# Patient Record
Sex: Female | Born: 1950 | Race: Black or African American | Hispanic: No | Marital: Married | Smoking: Never smoker
Health system: Southern US, Community
[De-identification: ages and names within clinical notes are randomized; demographics above are authoritative.]

## PROBLEM LIST (undated history)

## (undated) DIAGNOSIS — K5790 Diverticulosis of intestine, part unspecified, without perforation or abscess without bleeding: Secondary | ICD-10-CM

## (undated) DIAGNOSIS — D649 Anemia, unspecified: Secondary | ICD-10-CM

## (undated) DIAGNOSIS — I10 Essential (primary) hypertension: Secondary | ICD-10-CM

## (undated) DIAGNOSIS — Z5189 Encounter for other specified aftercare: Secondary | ICD-10-CM

---

## 1999-06-21 HISTORY — PX: EYE SURGERY: SHX253

## 2002-03-04 ENCOUNTER — Encounter: Admission: RE | Admit: 2002-03-04 | Discharge: 2002-03-04 | Payer: Self-pay | Admitting: Internal Medicine

## 2002-03-25 ENCOUNTER — Other Ambulatory Visit: Admission: RE | Admit: 2002-03-25 | Discharge: 2002-03-25 | Payer: Self-pay | Admitting: Internal Medicine

## 2005-02-10 ENCOUNTER — Inpatient Hospital Stay (HOSPITAL_COMMUNITY): Admission: EM | Admit: 2005-02-10 | Discharge: 2005-02-13 | Payer: Self-pay | Admitting: Emergency Medicine

## 2005-02-10 ENCOUNTER — Ambulatory Visit: Payer: Self-pay | Admitting: Family Medicine

## 2005-02-11 ENCOUNTER — Encounter (INDEPENDENT_AMBULATORY_CARE_PROVIDER_SITE_OTHER): Payer: Self-pay | Admitting: Specialist

## 2005-06-09 ENCOUNTER — Encounter: Admission: RE | Admit: 2005-06-09 | Discharge: 2005-06-09 | Payer: Self-pay | Admitting: Internal Medicine

## 2008-06-10 ENCOUNTER — Encounter: Admission: RE | Admit: 2008-06-10 | Discharge: 2008-06-10 | Payer: Self-pay | Admitting: Internal Medicine

## 2009-06-10 ENCOUNTER — Encounter: Admission: RE | Admit: 2009-06-10 | Discharge: 2009-06-10 | Payer: Self-pay | Admitting: Internal Medicine

## 2010-06-16 ENCOUNTER — Encounter
Admission: RE | Admit: 2010-06-16 | Discharge: 2010-06-16 | Payer: Self-pay | Source: Home / Self Care | Attending: Internal Medicine | Admitting: Internal Medicine

## 2010-11-05 NOTE — Discharge Summary (Signed)
Lauren Berry, Lauren Berry             ACCOUNT NO.:  [id number]   MEDICAL RECORD NO.:  [id number]          PATIENT TYPE:  INP   LOCATION:  6737                         FACILITY:  [facility]   PHYSICIAN:  Pearlean Brownie, M.D.DATE OF BIRTH:  14-Dec-1950   DATE OF ADMISSION:  02/10/2005  DATE OF DISCHARGE:  02/13/2005                                 DISCHARGE SUMMARY   PRIMARY CARE PHYSICIAN:  Barth Kirks, M.D.   DISCHARGE DIAGNOSES:  1.  Gastrointestinal bleed secondary to a diverticulum.  2.  Anemia secondary to gastrointestinal bleed.  3.  Hypertension.  4.  Obesity.   PROCEDURE:  Colonoscopy performed by Dr. Ewing Schlein revealed a diverticulum.  No  active source of bleeding.   LABORATORIES:  Hemoglobin on admission was 11.8, hematocrit on admission was  36.  Hemoglobin trended from 11.8 to 10.4 to 10.2 to 9, and was 10 on the  date of discharge and stable.  White count was 8.8 on the day of discharge.  Platelet count was 321.  BMP on the date of discharge:  Sodium 142,  potassium 3.8, chloride 107, bicarbonate 28, glucose 118, BUN 4, creatinine  0.8, calcium 8.9.  The patient had a urinalysis that was negative and  normal.  She also had a PT of 14.1, INR 1.1, and PTT 38.  There were GI  biopsy results pending at the time of this dictation.   HISTORY AND PHYSICAL:  Please see the dictated history and physical on  chart.   HOSPITAL COURSE:  1.  GI bleed.  The patient was admitted to a telemetry floor.  The patient      was typed and crossed for 2 units of blood.  Her hemoglobin was      monitored closely, given her GI bleed, for concern over exsanguination.      GI was consulted.  The patient underwent a bowel prep and then      colonoscopy within 24 hours.  The colonoscopy revealed no active source      of bleeding and revealed only a diverticulum which may be the possible      source of the patient's acute bright red blood per rectum.  The patient      remained in the hospital  another 24 hours and had no further bleeding      episodes.  Furthermore, hemoglobin was stable approximately 24 hours      prior to discharge and has actually increased to 10 on the day of      discharge.  The patient was sent home on iron sulfate 325 mg p.o. b.i.d.      for her anemia.  She was also sent home on Fibercon 1 g p.o. q.i.d. as a      laxative and stool softener to prevent further GI bleeds.  2.  Hypertension. The patient was started on Vasotec 5 mg p.o. daily, and it      will be continued at discharge.  3.  GI prophylaxis.  The patient was started on Protonix 40 mg p.o. daily,      and continued at discharge.  DISCHARGE MEDICATIONS:  1.  Protonix 40 mg p.o. daily.  2.  Iron sulfate 325 mg p.o. b.i.d.  3.  Vasotec 5 mg p.o. daily.  4.  Fibercon 1 tablet p.o. q.i.d.   DISCHARGE AND FOLLOW-UP INSTRUCTIONS:  The patient is instructed to call  [facility] to schedule a follow-up appointment with  Dr. Barth Kirks in two weeks.  She is also instructed to follow up with  Dr. Ewing Schlein for biopsy results in approximately a week.      Broadus John T. Pamalee Leyden, MD    ______________________________  Pearlean Brownie, M.D.    WTP/MEDQ  D:  02/13/2005  T:  02/14/2005  Job:  [id number]

## 2010-11-05 NOTE — H&P (Signed)
[name], [name]             ACCOUNT NO.:  [id number]   MEDICAL RECORD NO.:  [id number]          PATIENT TYPE:  EMS   LOCATION:  MAJO                         FACILITY:  [facility]   PHYSICIAN:  Leighton Roach McDiarmid, M.D.DATE OF BIRTH:  Jul 20, 1950   DATE OF ADMISSION:  02/10/2005  DATE OF DISCHARGE:                                HISTORY & PHYSICAL   PRIMARY MEDICAL DOCTOR:  Unassigned.   CHIEF COMPLAINT:  Bright red blood per rectum.   HISTORY OF PRESENT ILLNESS:  The patient is a 60 year old African-American  female who acutely went to the bathroom this morning around 5 a.m. and had  the urge to use the restroom.  The patient had loose stools with bright red  blood.  The amounts were large; however, no quantifiable amount was  established.  The patient went on to work, and then around 9:30 a.m. had  once again a repeated episode of bright red blood per rectum with her bowel  movement.  Her third bowel movement, which was around noon, had dark red  blood with it.  Both of these bowel movements were all runny.  The patient  was very concerned and came on into the emergency room after that, and had  one bowel movement while in the emergency room with dark blood.  She had  bright red blood on her tissue paper after wiping.  The patient ate  breakfast this morning and has had an appetite; however, had not eaten  anything since breakfast this morning.  The patient does complain of some  heartburn in the epigastric area off and on, but currently had no pain at  the time of evaluation.  The patient complains of intestinal gas very  frequently at home.  The patient states that it feels kind of like an  ulcer; however, she has never been diagnosed with GI problems before.  The  patient did eat [employer] chicken yesterday, which was very spicy, and she  had some indigestion; however, her husband ate a bite of it and did not have  similar symptoms.  Other significant history is that the patient  last week  passed some blood through her urine and has had some abdominal cramping, but  she has had increased stress.  The patient denies any fever, nausea, or  vomiting.  Also, the patient was recently being treated for a recluse spider  bite by Dr. Lajoyce Corners, an orthopedist, who has been treating her leg.   REVIEW OF SYSTEMS:  The patient denies any fever, nausea, or vomiting.  The  patient does state that 2 weeks ago she had a nosebleed with a cold.  She  did have a cough at that time.  She does not get much sleep at night.  She  awakens every 2 hours or so.  The patient does complain of some shortness of  breath with walking; however, she is asymptomatic at this time.   PAST MEDICAL HISTORY:  1.  Hypertension.  2.  Spider bite.   PAST SURGICAL HISTORY:  Two cesarean sections with a vertical incision, 24  and 30 years ago.  FAMILY HISTORY:  She is married with 2 children.  Her mother died in  08-May-2004 of breast cancer.  Her father had diabetes mellitus and  passed away in 11-06-1999.   SOCIAL HISTORY:  She denies any tobacco, ethanol, or illicit drug use.  She  works for [employer].   PRIMARY MEDICAL DOCTOR:  She has been using several different urgent care  centers as her M.D., but no specific ones specifically.  Her last urgent  care was at [facility].   MEDICATIONS:  The patient is currently not taking any medicines, except for  Advil p.r.n. pain.  The patient has been prescribed the blood pressure  medicines before, but she does not take them.  Her last filled medicines  were done at the [facility] on [address].  The patient also takes Noni juice  q.a.m., 2 tablespoons, but is uncertain exactly what this is.   PHYSICAL EXAMINATION:  GENERAL:  In no acute distress.  African-American  female, obese, slightly worried, but able to converse very easily.  HEENT:  Pupils equal, round and reactive to light bilaterally.  Extraocular  muscles intact.  Normocephalic and  atraumatic.  No masses noted in her neck  or thyroid area.  Moist mucous membranes and no oral lesions.  CARDIOVASCULAR:  Regular rate and rhythm.  No murmurs, rubs, or gallops.  LUNGS:  Clear to auscultation bilaterally.  EXTREMITIES:  5/5 strength bilaterally, upper and lower extremities.  ABDOMEN:  Obese, soft, nontender, nondistended.  Positive low-pitched bowel  sounds.  There was a vertical cesarean section scar.  There was no rebound  tenderness noted.  GU:  There were no masses noted in the vagina.  There was no noted bleeding  in the vaginal area.  GI:  There were no noted masses in the rectum.  There was positive blood on  the glove after examination.  There was no pain with rectal examination, and  no external hemorrhoids noted.  NEUROLOGIC:  Alert and oriented x3 and communicating well.  Somewhat  stressed due to the acuteness of the onset.  VITAL SIGNS:  T-max of 99.6, pulse 106 (which went down to 83 after she was  calmed down), blood pressure 223 systolic over 111 diastolic (after she was  given 20 of Labetalol, it came down to 125/56), respirations 22.   LABORATORIES ON ADMISSION:  White blood cell count was 11.1 with neutrophils  of 80, and absolute neutrophil count of 8.9.  Hemoglobin was 11.8,  hematocrit 36, platelets of 342.  Sodium was 139, potassium 3.8, chloride  107, bicarbonate of 24, BUN of 11, creatinine 0.7, glucose of 118, calcium  8.6.  Total protein 7.9, albumin 3.1, AST 13, ALT 10, alkaline phosphatase  104, and total bilirubin was 0.5.  Lipase was 16.  Urinalysis was within  normal limits, and she was occult blood positive per rectum.   ASSESSMENT AND PLAN:  This is a 60 year old African-American female with  bright red blood per rectum x1 day and hypertension.  1.  Rectal bleeding.  This is an acute onset.  There is no previous history      of GI symptoms or GI history.  Differential includes internal     hemorrhoids versus bleeding diverticulitis  versus brisk bleeding ulcer      versus mass bleeding in the colon.  There is no history of left-sided      pain, no history of constipation which makes diverticulitis less likely.      Due  to the bright red blood onset, this could also be significant for      left-sided colonic lesion.  We will keep the patient NPO.  We will      consult GI in the a.m. for a colonoscopy and possibly an EGD.  The      patient does deny nausea or vomiting, and no pain.  Hemoglobin was 11.      Nasogastric tube was not inserted due to the fact that the patient is      not vomiting or nauseous.  We will recheck a CBC in the a.m.  We will      consider stool cultures for questionable infectious causes due to this      history of eating chicken the night before to rule out any significant      infectious causes.  2.  Hypertension.  The patient is known to have high blood pressure;      however, she is not taking her medicines at home.  Previous blood      pressures ranged from the upper 220s to 140s systolic with blood      pressure medicines.  The patient is uncertain of her medications at      home, and we will go ahead and start HCTZ at 25 mg p.o. in the a.m.  The      patient received labetalol 20 mg IV in the ED, which brought it down      from the 220 systolic down to the 140s.  The patient was very stressed      at the time of admission.  We will monitor her blood pressures q.4h.  3.  Gastrointestinal prophylaxis.  We will place the patient NPO and place      on Protonix 40 mg b.i.d.  We will check for H. pylori antigens for      questionable ulcer causes.  She denies nausea or vomiting.  4.  Deep vein thrombosis prophylaxis.  The patient will be ambulating.  5.  History of nosebleeds.  We will check a PT, PTT, and an INR.  The      patient with questionable history of episodic epistaxis with colds and      previous history of vaginal bleeding in the past.  The patient has gone      through menopause at  age 89.  6.  History of a spider bite.  Dr. Lajoyce Corners, orthopedic doctor, has been      treating her left leg for a status post recluse spider bite.  It was      wrapped well.  There was no drainage noted on the outside of the      bandage.  There were no acute issues.  7.  Code status.  The patient is labeled as a full code.      Barth Kirks, M.D.    ______________________________  Leighton Roach McDiarmid, M.D.    MB/MEDQ  D:  02/10/2005  T:  02/11/2005  Job:  [id number]   cc:   [facility] Teaching Service

## 2010-11-05 NOTE — Consult Note (Signed)
NAMEBERANIA, PEEDIN             ACCOUNT NO.:  [id number]   MEDICAL RECORD NO.:  [id number]          PATIENT TYPE:  INP   LOCATION:  1823                         FACILITY:  [facility]   PHYSICIAN:  Althea Grimmer. Santogade, M.D.DATE OF BIRTH:  April 24, 1951   DATE OF CONSULTATION:  02/10/2005  DATE OF DISCHARGE:                                   CONSULTATION   Lauren Berry is a morbidly obese 60 year old female who I am asked to see  for hematochezia.  This began this morning.  She has had multiple episodes  of bright red and dark red blood.  This did not appear melenic.  She has  minimal, if any, epigastric symptoms.  She has had occasional heartburn.  She does have lower abdominal cramping, more on the right side than the  left.  She has never had episodes of bleeding like this before nor has she  ever had any colorectal cancer screening.  She has chronic venous  insufficiency with an ulcer on her right lower extremity that is painful and  a remote history of spider bite on her left lower extremity with concomitant  problems with venous stasis dermatitis.  For this, she has been taking  aspirin and Advil, which she says has not bothered her stomach at all.  She  denies weight loss, constipation, diarrhea, or other ongoing GI symptoms.  In the emergency room, a bowel movement was observed, which was bright red.   PAST MEDICAL HISTORY:  Pertinent for mild heartburn, hypertension, and  venous stasis dermatitis along with morbid obesity.   PAST SURGICAL HISTORY:  Cesarean sections x2.   CURRENT MEDICATIONS:  None regularly.   FAMILY HISTORY:  Negative for colorectal neoplasias.   SOCIAL HISTORY:  Nonsmoker and nondrinker.   PHYSICAL EXAMINATION:  VITAL SIGNS:  Blood pressure on admission was  223/111.  Currently is 125/56.  Pulse 83.  She is not orthostatic.  GENERAL:  She is in no acute distress.  Morbidly obese.  SKIN:  Venous stasis dermatitis of the left lower extremity.  Her right  lower extremity appears to be in an Radio broadcast assistant.  HEENT:  Eyes are anicteric.  Conjunctivae pink.  CHEST:  Breath sounds distant but clear.  HEART:  Heart sounds distant.  Regular rate and rhythm.  ABDOMEN:  Morbidly obese and nontender without mass or organomegaly.  There  is no rebound or hernia.  RECTAL:  Not repeated.  EXTREMITIES:  Previously described.   Hemoglobin 11.1, platelet count 342.  Creatinine 0.7.  BUN 11.  Liver  function tests normal.   IMPRESSION:  This is almost undoubtedly a lower gastrointestinal bleed.  Since the patient is not orthostatic, she has few, if any upper  gastrointestinal symptoms and her BUN is normal.   PLAN:  We will push clear fluids and attempt to prep her for colonoscopy  overnight and in the early morning.  The colonoscopy will be scheduled for  the afternoon tomorrow.  She should be transfused if necessary to keep her  hemoglobin above 9.  Further recommendations will follow the results of the  colonoscopy.  If it  is negative, consideration could be given to an upper  endoscopy, which I doubt is necessary.      Althea Grimmer. Luther Parody, M.D.  Electronically Signed     PJS/MEDQ  D:  02/10/2005  T:  02/10/2005  Job:  [id number]   cc:   Barth Kirks, M.D.  Fax: [phone]

## 2010-11-05 NOTE — Op Note (Signed)
Lauren Berry, Lauren Berry             ACCOUNT NO.:  [id number]   MEDICAL RECORD NO.:  [id number]          PATIENT TYPE:  INP   LOCATION:  6737                         FACILITY:  [facility]   PHYSICIAN:  Petra Kuba, M.D.    DATE OF BIRTH:  December 11, 1950   DATE OF PROCEDURE:  02/11/2005  DATE OF DISCHARGE:                                 OPERATIVE REPORT   PROCEDURE:  Colonoscopy.   INDICATION:  Bright red blood per rectum, anemia. Consent was signed after  risks, benefits, methods, options thoroughly discussed both Dr. Luther Parody  and me prior to any sedation.   MEDICINES USED:  Demerol 60, Versed 7.5.   PROCEDURE:  Rectal inspection is pertinent for small external hemorrhoids.  Digital exam was negative. The video pediatric adjustable colonoscope was  inserted and advanced through the sigmoid which did have a moderate amount  of diverticula.  There was also some wall edema and erythema.  We did need  some abdominal pressure due to looping and once past this area, was easily  able to advance around the colon to the cecum which was identified by the  appendiceal orifice and ileocecal valve. No signs of active bleeding were  seen. There was some dirty stool, some formed stool and some very minimal  blood-tinged stool, the majority of which was washed and suctioned, but some  of the stool could not be washed.  On slow withdrawal through the colon  there was a rare right diverticula. The majority were on the left side.  The  scope was withdrawn back to the area in the distal descending, proximal  sigmoid. There was lots of spasm and edema and she had difficulty holding  air at this junction so visualization was difficult.  Although it did not  look like a frank mass lesion, some of the edema and erythema was cold  biopsied and put in the first container. There was one area that seemed a  little more polypoid in the proximal sigmoid. This was cold biopsied and put  in a second container. The  scope was slowly withdrawn. The rectum was normal  except for some small hemorrhoids, evaluated on straight and retroflex  visualization. Again no signs of active bleeding were seen.  Air was  suctioned, scope removed. The patient tolerated the procedure adequately.  There is no obvious immediate complication.   ENDOSCOPIC DIAGNOSES:  1.  Internal-external hemorrhoids.  2.  Moderate left and greater than rare right diverticula.  3.  Proximal sigmoid with increased spasm, edema, erythema, status post      biopsy. She also had difficulty holding air which made visualization      difficult.  4.  Questionable polyp in this area as well, status post biopsy.  Could not      get a good look at due to difficulty holding air and increased spasm.  5.  No active bleeding seen.  6.  Otherwise within normal limits to the cecum.   PLAN:  Await pathology.  Slowly advance diet, if no delayed complications.  Follow CBC.  Can follow up biopsies, symptoms, etc. as an  outpatient if  needed.           ______________________________  Petra Kuba, M.D.     MEM/MEDQ  D:  02/11/2005  T:  02/12/2005  Job:  [id number]   cc:   Leighton Roach McDiarmid, M.D.  Fax: [phone]

## 2011-05-27 ENCOUNTER — Other Ambulatory Visit: Payer: Self-pay | Admitting: Internal Medicine

## 2011-05-27 DIAGNOSIS — Z1231 Encounter for screening mammogram for malignant neoplasm of breast: Secondary | ICD-10-CM

## 2011-10-03 ENCOUNTER — Other Ambulatory Visit: Payer: Self-pay | Admitting: Gastroenterology

## 2011-10-03 ENCOUNTER — Ambulatory Visit (HOSPITAL_COMMUNITY)
Admission: RE | Admit: 2011-10-03 | Discharge: 2011-10-03 | Disposition: A | Discharge status: Home Health | Payer: 59 | Source: Ambulatory Visit | Attending: Gastroenterology | Admitting: Gastroenterology

## 2011-10-03 ENCOUNTER — Encounter (HOSPITAL_COMMUNITY): Payer: Self-pay | Admitting: *Deleted

## 2011-10-03 ENCOUNTER — Encounter (HOSPITAL_COMMUNITY)
Admission: RE | Disposition: A | Discharge status: Home Health | Payer: Self-pay | Source: Ambulatory Visit | Attending: Gastroenterology

## 2011-10-03 DIAGNOSIS — K5730 Diverticulosis of large intestine without perforation or abscess without bleeding: Secondary | ICD-10-CM | POA: Insufficient documentation

## 2011-10-03 DIAGNOSIS — K5289 Other specified noninfective gastroenteritis and colitis: Secondary | ICD-10-CM | POA: Insufficient documentation

## 2011-10-03 DIAGNOSIS — Z1211 Encounter for screening for malignant neoplasm of colon: Secondary | ICD-10-CM | POA: Insufficient documentation

## 2011-10-03 DIAGNOSIS — Z5309 Procedure and treatment not carried out because of other contraindication: Secondary | ICD-10-CM | POA: Insufficient documentation

## 2011-10-03 HISTORY — DX: Encounter for other specified aftercare: Z51.89

## 2011-10-03 HISTORY — DX: Essential (primary) hypertension: I10

## 2011-10-03 HISTORY — PX: COLONOSCOPY: SHX5424

## 2011-10-03 HISTORY — DX: Diverticulosis of intestine, part unspecified, without perforation or abscess without bleeding: K57.90

## 2011-10-03 HISTORY — DX: Anemia, unspecified: D64.9

## 2011-10-03 LAB — GLUCOSE, CAPILLARY: Glucose-Capillary: 83 mg/dL (ref 70–99)

## 2011-10-03 SURGERY — COLONOSCOPY
Anesthesia: Moderate Sedation

## 2011-10-03 MED ORDER — FENTANYL CITRATE 0.05 MG/ML IJ SOLN
INTRAMUSCULAR | Status: DC | PRN
  Administered 2011-10-03 (×5): 25 ug via INTRAVENOUS

## 2011-10-03 MED ORDER — SODIUM CHLORIDE 0.9 % IV SOLN
Freq: Once | INTRAVENOUS | Status: AC
  Administered 2011-10-03: 500 mL via INTRAVENOUS

## 2011-10-03 MED ORDER — FENTANYL CITRATE 0.05 MG/ML IJ SOLN
INTRAMUSCULAR | Status: AC
  Filled 2011-10-03: qty 4

## 2011-10-03 MED ORDER — DIPHENHYDRAMINE HCL 50 MG/ML IJ SOLN
INTRAMUSCULAR | Status: AC
  Filled 2011-10-03: qty 1

## 2011-10-03 MED ORDER — MIDAZOLAM HCL 10 MG/2ML IJ SOLN
INTRAMUSCULAR | Status: DC | PRN
  Administered 2011-10-03 (×2): 2 mg via INTRAVENOUS
  Administered 2011-10-03: 4 mg via INTRAVENOUS
  Administered 2011-10-03: 2 mg via INTRAVENOUS

## 2011-10-03 MED ORDER — MIDAZOLAM HCL 10 MG/2ML IJ SOLN
INTRAMUSCULAR | Status: AC
  Filled 2011-10-03: qty 4

## 2011-10-03 NOTE — Op Note (Signed)
[facility] [address] [place], [place]  [address]  OPERATIVE PROCEDURE REPORT  PATIENT:  Lauren Berry, Lauren Berry  MR#:  [id number] BIRTHDATE:  Dec 16, 1950  GENDER:  female ENDOSCOPIST:  Dr. Lorenza Burton, MD ASSISTANT:  Kandice Robinsons, technician and Felecia Shelling, RN.  PROCEDURE DATE:  10/03/2011 PRE-PROCEDURE PREPERATION:  The patient was prepped with 2 dulcolax tablets, one ten-ounce bottle of magnesium citrate, and a gallon of Golytely the night prior to the procedure.  The patient was fasted for 8 hours prior to the procedure. PRE-PROCEDURE PHYSICAL:  Patient has stable vital signs. Neck is supple. There is no JVD, thyromegaly or LAD. Chest clear to auscultation. S1 and S2 regular. Abdomen soft, morbidy obse, non-distended, non-tender with NABS. PROCEDURE:  Colonoscopy changed to  flexible sigmoidoscopy with cold biopsy x 1. ASA CLASS:  Class III INDICATIONS:  1) Colorectal cancer screening-with 5 day repeat prep. MEDICATIONS:  Fentanyl 125 mcg & Versed 10 mg IV.  DESCRIPTION OF PROCEDURE: After the risks, benefits, and alternatives of the procedure were thoroughly explained [including a 10% missed rate of cancer and polyps], informed consent was obtained.  Digital rectal exam was performed.  The Pentax video colonoscope [id number] was introduced through the anus and advanced to the descending colon upto 60 cm, limited by a redundant colon and a very poor preparation. The quality of the prep was prep is suboptimal. Multiple washes were done. Small lesions could be missed. The instrument was then slowly withdrawn as the colon was fully examined. <<PROCEDUREIMAGES>>  FINDINGS:  There was an ?mass at 60 cm that was biopsied x 1. Few scattered diverticul were also noted. The procedure was aborted at 60 cm ue to a poor prep. The patient tolerated the procedure without immediate complications. The scope was then withdrawn from the patient and the procedure  terminated.  IMPRESSION:  ?Mass at 60 cm-biopsied x 1; POOR PREP; pocedure aborted at 60cm. . RECOMMENDATIONS:  1) Avoid all NSAIDS for the next 2 weeks. 2) Await pathology results 3) Schedule a virtual colonoscopy ASAP.  REPEAT EXAM:  None planned for now.  DISCHARGE INSTRUCTIONS:  Standard discharge instructions given.  ______________________________ Dr. Lorenza Burton, MD  CPT CODES: [phone]  DIAGNOSIS CODES:  V76.51, 562.10  CC:  Dorothyann Peng, M.D.  n. eSIGNED:   Dr. Lorenza Burton at 10/03/2011 04:49 PM  Natasha Bence, [phone]

## 2011-10-03 NOTE — Consult Note (Signed)
Reason for Consult: Colorectal cancer screening. Referring Physician: Dr. Dorothyann Peng.  Lauren Berry is an 61 y.o. female.  HPI: Patient is here for a screening colonoscopy. She denies any active GI complaints at this time.  Past Medical History  Diagnosis Date  . Hypertension   . Anemia   . Blood transfusion   . Cataract   . Hemorrhoids internal and external  . Diverticulosis     Past Surgical History  Procedure Date  . Cesarean section   . Eye surgery     bilateral cataract   History reviewed. No pertinent family history.  Social History:  reports that she has never smoked. She does not have any smokeless tobacco history on file. She reports that she does not drink alcohol or use illicit drugs.  Allergies:  Allergies  Allergen Reactions  . Penicillins    Medications: I have reviewed the patient's current medications.  No results found.  Review of Systems  Constitutional: Negative.   HENT: Negative.   Eyes: Negative.   Respiratory: Negative.   Skin: Negative.   Neurological: Negative.   Psychiatric/Behavioral: Negative.    Blood pressure 134/78, temperature 98.5 F (36.9 C), temperature source Oral, resp. rate 15, height 5\' 1"  (1.549 m), weight 106.142 kg (234 lb), SpO2 100.00%. Physical Exam  Constitutional: She is oriented to person, place, and time. She appears well-developed and well-nourished.  HENT:  Head: Normocephalic and atraumatic.  Eyes: Conjunctivae and EOM are normal. Pupils are equal, round, and reactive to light.  Neck: Normal range of motion. Neck supple.  Cardiovascular: Normal rate, regular rhythm and normal heart sounds.   Respiratory: Breath sounds normal.  GI: Soft. Bowel sounds are normal.  Musculoskeletal: Normal range of motion.  Neurological: She is alert and oriented to person, place, and time. She has normal reflexes.  Skin: Skin is warm and dry.  Psychiatric: She has a normal mood and affect. Her behavior is normal. Judgment  and thought content normal.    Assessment/Plan: Colorectal cancer screening : Proceed with a colonoscopy at this time.  [name] 10/03/2011, 3:59 PM

## 2011-10-03 NOTE — Discharge Instructions (Signed)
Endoscopy °Care After °Please read the instructions outlined below and refer to this sheet in the next few weeks. These discharge instructions provide you with general information on caring for yourself after you leave the hospital. Your doctor may also give you specific instructions. While your treatment has been planned according to the most current medical practices available, unavoidable complications occasionally occur. If you have any problems or questions after discharge, please call your doctor. °HOME CARE INSTRUCTIONS °Activity °· You may resume your regular activity but move at a slower pace for the next 24 hours.  °· Take frequent rest periods for the next 24 hours.  °· Walking will help expel (get rid of) the air and reduce the bloated feeling in your abdomen.  °· No driving for 24 hours (because of the anesthesia (medicine) used during the test).  °· You may shower.  °· Do not sign any important legal documents or operate any machinery for 24 hours (because of the anesthesia used during the test).  °Nutrition °· Drink plenty of fluids.  °· You may resume your normal diet.  °· Begin with a light meal and progress to your normal diet.  °· Avoid alcoholic beverages for 24 hours or as instructed by your caregiver.  °Medications °You may resume your normal medications unless your caregiver tells you otherwise. °What you can expect today °· You may experience abdominal discomfort such as a feeling of fullness or "gas" pains.  °· You may experience a sore throat for 2 to 3 days. This is normal. Gargling with salt water may help this.  °Follow-up °Your doctor will discuss the results of your test with you. °SEEK IMMEDIATE MEDICAL CARE IF: °· You have excessive nausea (feeling sick to your stomach) and/or vomiting.  °· You have severe abdominal pain and distention (swelling).  °· You have trouble swallowing.  °· You have a temperature over 100° F (37.8° C).  °· You have rectal bleeding or vomiting of blood.    °Document Released: 01/19/2004 Document Revised: 02/16/2011 Document Reviewed: 08/01/2007 °ExitCare® Patient Information ©2012 [employer]. °

## 2011-10-04 ENCOUNTER — Encounter (HOSPITAL_COMMUNITY): Payer: Self-pay | Admitting: Gastroenterology

## 2012-02-08 ENCOUNTER — Ambulatory Visit
Admission: RE | Admit: 2012-02-08 | Discharge: 2012-02-08 | Disposition: A | Discharge status: Home / Self Care | Payer: 59 | Source: Ambulatory Visit | Attending: Internal Medicine | Admitting: Internal Medicine

## 2012-02-08 DIAGNOSIS — Z1231 Encounter for screening mammogram for malignant neoplasm of breast: Secondary | ICD-10-CM

## 2013-04-04 ENCOUNTER — Other Ambulatory Visit: Payer: Self-pay

## 2013-04-04 DIAGNOSIS — Z1231 Encounter for screening mammogram for malignant neoplasm of breast: Secondary | ICD-10-CM

## 2013-04-22 ENCOUNTER — Ambulatory Visit
Admission: RE | Admit: 2013-04-22 | Discharge: 2013-04-22 | Disposition: A | Discharge status: Home / Self Care | Payer: 59 | Source: Ambulatory Visit

## 2013-04-22 DIAGNOSIS — Z1231 Encounter for screening mammogram for malignant neoplasm of breast: Secondary | ICD-10-CM

## 2014-06-10 ENCOUNTER — Other Ambulatory Visit: Payer: Self-pay

## 2014-06-10 DIAGNOSIS — Z1231 Encounter for screening mammogram for malignant neoplasm of breast: Secondary | ICD-10-CM

## 2014-06-16 ENCOUNTER — Encounter (INDEPENDENT_AMBULATORY_CARE_PROVIDER_SITE_OTHER): Payer: Self-pay

## 2014-06-16 ENCOUNTER — Ambulatory Visit
Admission: RE | Admit: 2014-06-16 | Discharge: 2014-06-16 | Disposition: A | Discharge status: Home / Self Care | Payer: 59 | Source: Ambulatory Visit

## 2014-06-16 DIAGNOSIS — Z1231 Encounter for screening mammogram for malignant neoplasm of breast: Secondary | ICD-10-CM

## 2015-05-12 ENCOUNTER — Other Ambulatory Visit: Payer: Self-pay

## 2015-05-12 DIAGNOSIS — Z1231 Encounter for screening mammogram for malignant neoplasm of breast: Secondary | ICD-10-CM

## 2015-06-18 ENCOUNTER — Ambulatory Visit
Admission: RE | Admit: 2015-06-18 | Discharge: 2015-06-18 | Disposition: A | Discharge status: Home / Self Care | Payer: 59 | Source: Ambulatory Visit

## 2015-06-18 DIAGNOSIS — Z1231 Encounter for screening mammogram for malignant neoplasm of breast: Secondary | ICD-10-CM

## 2016-02-01 DIAGNOSIS — Z6841 Body Mass Index (BMI) 40.0 and over, adult: Secondary | ICD-10-CM | POA: Diagnosis not present

## 2016-02-01 DIAGNOSIS — M25562 Pain in left knee: Secondary | ICD-10-CM | POA: Diagnosis not present

## 2016-02-01 DIAGNOSIS — R7309 Other abnormal glucose: Secondary | ICD-10-CM | POA: Diagnosis not present

## 2016-02-01 DIAGNOSIS — E559 Vitamin D deficiency, unspecified: Secondary | ICD-10-CM | POA: Diagnosis not present

## 2016-02-01 DIAGNOSIS — I10 Essential (primary) hypertension: Secondary | ICD-10-CM | POA: Diagnosis not present

## 2016-02-01 DIAGNOSIS — Z0000 Encounter for general adult medical examination without abnormal findings: Secondary | ICD-10-CM | POA: Diagnosis not present

## 2016-06-02 DIAGNOSIS — Z79899 Other long term (current) drug therapy: Secondary | ICD-10-CM | POA: Diagnosis not present

## 2016-06-02 DIAGNOSIS — R7309 Other abnormal glucose: Secondary | ICD-10-CM | POA: Diagnosis not present

## 2016-06-02 DIAGNOSIS — Z6841 Body Mass Index (BMI) 40.0 and over, adult: Secondary | ICD-10-CM | POA: Diagnosis not present

## 2016-06-02 DIAGNOSIS — Z1389 Encounter for screening for other disorder: Secondary | ICD-10-CM | POA: Diagnosis not present

## 2016-06-02 DIAGNOSIS — I10 Essential (primary) hypertension: Secondary | ICD-10-CM | POA: Diagnosis not present

## 2016-06-17 ENCOUNTER — Other Ambulatory Visit: Payer: Self-pay | Admitting: Internal Medicine

## 2016-06-17 DIAGNOSIS — Z1231 Encounter for screening mammogram for malignant neoplasm of breast: Secondary | ICD-10-CM

## 2016-07-05 ENCOUNTER — Ambulatory Visit
Admission: RE | Admit: 2016-07-05 | Discharge: 2016-07-05 | Disposition: A | Discharge status: Home / Self Care | Payer: Commercial Managed Care - HMO | Source: Ambulatory Visit | Attending: Internal Medicine | Admitting: Internal Medicine

## 2016-07-05 DIAGNOSIS — Z1231 Encounter for screening mammogram for malignant neoplasm of breast: Secondary | ICD-10-CM

## 2016-07-21 DIAGNOSIS — H40053 Ocular hypertension, bilateral: Secondary | ICD-10-CM | POA: Diagnosis not present

## 2016-07-21 DIAGNOSIS — H40013 Open angle with borderline findings, low risk, bilateral: Secondary | ICD-10-CM | POA: Diagnosis not present

## 2016-08-18 DIAGNOSIS — H40053 Ocular hypertension, bilateral: Secondary | ICD-10-CM | POA: Diagnosis not present

## 2016-08-18 DIAGNOSIS — H40013 Open angle with borderline findings, low risk, bilateral: Secondary | ICD-10-CM | POA: Diagnosis not present

## 2016-09-30 DIAGNOSIS — H5203 Hypermetropia, bilateral: Secondary | ICD-10-CM | POA: Diagnosis not present

## 2016-09-30 DIAGNOSIS — H52209 Unspecified astigmatism, unspecified eye: Secondary | ICD-10-CM | POA: Diagnosis not present

## 2016-09-30 DIAGNOSIS — H524 Presbyopia: Secondary | ICD-10-CM | POA: Diagnosis not present

## 2016-11-25 DIAGNOSIS — I10 Essential (primary) hypertension: Secondary | ICD-10-CM | POA: Diagnosis not present

## 2016-11-25 DIAGNOSIS — R7309 Other abnormal glucose: Secondary | ICD-10-CM | POA: Diagnosis not present

## 2016-12-29 ENCOUNTER — Other Ambulatory Visit: Payer: Self-pay | Admitting: Internal Medicine

## 2016-12-29 DIAGNOSIS — E2839 Other primary ovarian failure: Secondary | ICD-10-CM

## 2017-01-10 ENCOUNTER — Ambulatory Visit
Admission: RE | Admit: 2017-01-10 | Discharge: 2017-01-10 | Disposition: A | Discharge status: Home / Self Care | Payer: Medicare Other | Source: Ambulatory Visit | Attending: Internal Medicine | Admitting: Internal Medicine

## 2017-01-10 DIAGNOSIS — E2839 Other primary ovarian failure: Secondary | ICD-10-CM

## 2017-01-10 DIAGNOSIS — M8589 Other specified disorders of bone density and structure, multiple sites: Secondary | ICD-10-CM | POA: Diagnosis not present

## 2017-03-16 DIAGNOSIS — Z0000 Encounter for general adult medical examination without abnormal findings: Secondary | ICD-10-CM | POA: Diagnosis not present

## 2017-03-16 DIAGNOSIS — E559 Vitamin D deficiency, unspecified: Secondary | ICD-10-CM | POA: Diagnosis not present

## 2017-03-16 DIAGNOSIS — R7309 Other abnormal glucose: Secondary | ICD-10-CM | POA: Diagnosis not present

## 2017-03-16 DIAGNOSIS — I10 Essential (primary) hypertension: Secondary | ICD-10-CM | POA: Diagnosis not present

## 2017-06-08 ENCOUNTER — Other Ambulatory Visit: Payer: Self-pay | Admitting: Internal Medicine

## 2017-06-08 DIAGNOSIS — Z1231 Encounter for screening mammogram for malignant neoplasm of breast: Secondary | ICD-10-CM

## 2017-07-07 ENCOUNTER — Ambulatory Visit
Admission: RE | Admit: 2017-07-07 | Discharge: 2017-07-07 | Disposition: A | Discharge status: Home / Self Care | Payer: Medicare Other | Source: Ambulatory Visit | Attending: Internal Medicine | Admitting: Internal Medicine

## 2017-07-07 DIAGNOSIS — Z1231 Encounter for screening mammogram for malignant neoplasm of breast: Secondary | ICD-10-CM | POA: Diagnosis not present

## 2017-07-17 DIAGNOSIS — M25562 Pain in left knee: Secondary | ICD-10-CM | POA: Diagnosis not present

## 2017-07-17 DIAGNOSIS — Z79899 Other long term (current) drug therapy: Secondary | ICD-10-CM | POA: Diagnosis not present

## 2017-07-17 DIAGNOSIS — I10 Essential (primary) hypertension: Secondary | ICD-10-CM | POA: Diagnosis not present

## 2017-07-17 DIAGNOSIS — R7309 Other abnormal glucose: Secondary | ICD-10-CM | POA: Diagnosis not present

## 2017-08-28 DIAGNOSIS — R05 Cough: Secondary | ICD-10-CM | POA: Diagnosis not present

## 2017-08-28 DIAGNOSIS — Z803 Family history of malignant neoplasm of breast: Secondary | ICD-10-CM | POA: Diagnosis not present

## 2017-11-15 DIAGNOSIS — Z79899 Other long term (current) drug therapy: Secondary | ICD-10-CM | POA: Diagnosis not present

## 2017-11-15 DIAGNOSIS — I10 Essential (primary) hypertension: Secondary | ICD-10-CM | POA: Diagnosis not present

## 2017-11-15 DIAGNOSIS — R7309 Other abnormal glucose: Secondary | ICD-10-CM | POA: Diagnosis not present

## 2017-12-27 DIAGNOSIS — I10 Essential (primary) hypertension: Secondary | ICD-10-CM | POA: Diagnosis not present

## 2017-12-27 DIAGNOSIS — Z6841 Body Mass Index (BMI) 40.0 and over, adult: Secondary | ICD-10-CM | POA: Diagnosis not present

## 2018-04-26 ENCOUNTER — Ambulatory Visit (INDEPENDENT_AMBULATORY_CARE_PROVIDER_SITE_OTHER): Payer: Medicare HMO

## 2018-04-26 ENCOUNTER — Encounter: Payer: Self-pay | Admitting: Internal Medicine

## 2018-04-26 ENCOUNTER — Ambulatory Visit (INDEPENDENT_AMBULATORY_CARE_PROVIDER_SITE_OTHER): Payer: Medicare HMO | Admitting: Internal Medicine

## 2018-04-26 VITALS — BP 144/82 | HR 83 | Temp 98.3°F | Ht 62.0 in | Wt 277.0 lb

## 2018-04-26 DIAGNOSIS — Z800 Family history of malignant neoplasm of digestive organs: Secondary | ICD-10-CM | POA: Diagnosis not present

## 2018-04-26 DIAGNOSIS — Z6841 Body Mass Index (BMI) 40.0 and over, adult: Secondary | ICD-10-CM

## 2018-04-26 DIAGNOSIS — Z0000 Encounter for general adult medical examination without abnormal findings: Secondary | ICD-10-CM | POA: Diagnosis not present

## 2018-04-26 DIAGNOSIS — Z1211 Encounter for screening for malignant neoplasm of colon: Secondary | ICD-10-CM | POA: Diagnosis not present

## 2018-04-26 DIAGNOSIS — I10 Essential (primary) hypertension: Secondary | ICD-10-CM

## 2018-04-26 DIAGNOSIS — R7309 Other abnormal glucose: Secondary | ICD-10-CM | POA: Diagnosis not present

## 2018-04-26 DIAGNOSIS — Z23 Encounter for immunization: Secondary | ICD-10-CM

## 2018-04-26 LAB — POCT URINALYSIS DIPSTICK
Bilirubin, UA: NEGATIVE
Blood, UA: NEGATIVE
Glucose, UA: NEGATIVE
Ketones, UA: NEGATIVE
Leukocytes, UA: NEGATIVE
Nitrite, UA: NEGATIVE
Protein, UA: NEGATIVE
Spec Grav, UA: 1.015 (ref 1.010–1.025)
Urobilinogen, UA: 0.2 E.U./dL
pH, UA: 7 (ref 5.0–8.0)

## 2018-04-26 NOTE — Addendum Note (Signed)
Addended by: Elisha Ponder E on: 04/26/2018 01:26 PM   Modules accepted: Orders

## 2018-04-26 NOTE — Patient Instructions (Signed)
DASH Eating Plan DASH stands for "Dietary Approaches to Stop Hypertension." The DASH eating plan is a healthy eating plan that has been shown to reduce high blood pressure (hypertension). It may also reduce your risk for type 2 diabetes, heart disease, and stroke. The DASH eating plan may also help with weight loss. What are tips for following this plan? General guidelines  Avoid eating more than 2,300 mg (milligrams) of salt (sodium) a day. If you have hypertension, you may need to reduce your sodium intake to 1,500 mg a day.  Limit alcohol intake to no more than 1 drink a day for nonpregnant women and 2 drinks a day for men. One drink equals 12 oz of beer, 5 oz of wine, or 1 oz of hard liquor.  Work with your health care provider to maintain a healthy body weight or to lose weight. Ask what an ideal weight is for you.  Get at least 30 minutes of exercise that causes your heart to beat faster (aerobic exercise) most days of the week. Activities may include walking, swimming, or biking.  Work with your health care provider or diet and nutrition specialist (dietitian) to adjust your eating plan to your individual calorie needs. Reading food labels  Check food labels for the amount of sodium per serving. Choose foods with less than 5 percent of the Daily Value of sodium. Generally, foods with less than 300 mg of sodium per serving fit into this eating plan.  To find whole grains, look for the word "whole" as the first word in the ingredient list. Shopping  Buy products labeled as "low-sodium" or "no salt added."  Buy fresh foods. Avoid canned foods and premade or frozen meals. Cooking  Avoid adding salt when cooking. Use salt-free seasonings or herbs instead of table salt or sea salt. Check with your health care provider or pharmacist before using salt substitutes.  Do not fry foods. Cook foods using healthy methods such as baking, boiling, grilling, and broiling instead.  Cook with  heart-healthy oils, such as olive, canola, soybean, or sunflower oil. Meal planning   Eat a balanced diet that includes: ? 5 or more servings of fruits and vegetables each day. At each meal, try to fill half of your plate with fruits and vegetables. ? Up to 6-8 servings of whole grains each day. ? Less than 6 oz of lean meat, poultry, or fish each day. A 3-oz serving of meat is about the same size as a deck of cards. One egg equals 1 oz. ? 2 servings of low-fat dairy each day. ? A serving of nuts, seeds, or beans 5 times each week. ? Heart-healthy fats. Healthy fats called Omega-3 fatty acids are found in foods such as flaxseeds and coldwater fish, like sardines, salmon, and mackerel.  Limit how much you eat of the following: ? Canned or prepackaged foods. ? Food that is high in trans fat, such as fried foods. ? Food that is high in saturated fat, such as fatty meat. ? Sweets, desserts, sugary drinks, and other foods with added sugar. ? Full-fat dairy products.  Do not salt foods before eating.  Try to eat at least 2 vegetarian meals each week.  Eat more home-cooked food and less restaurant, buffet, and fast food.  When eating at a restaurant, ask that your food be prepared with less salt or no salt, if possible. What foods are recommended? The items listed may not be a complete list. Talk with your dietitian about what   dietary choices are best for you. Grains Whole-grain or whole-wheat bread. Whole-grain or whole-wheat pasta. Brown rice. Oatmeal. Quinoa. Bulgur. Whole-grain and low-sodium cereals. Pita bread. Low-fat, low-sodium crackers. Whole-wheat flour tortillas. Vegetables Fresh or frozen vegetables (raw, steamed, roasted, or grilled). Low-sodium or reduced-sodium tomato and vegetable juice. Low-sodium or reduced-sodium tomato sauce and tomato paste. Low-sodium or reduced-sodium canned vegetables. Fruits All fresh, dried, or frozen fruit. Canned fruit in natural juice (without  added sugar). Meat and other protein foods Skinless chicken or turkey. Ground chicken or turkey. Pork with fat trimmed off. Fish and seafood. Egg whites. Dried beans, peas, or lentils. Unsalted nuts, nut butters, and seeds. Unsalted canned beans. Lean cuts of beef with fat trimmed off. Low-sodium, lean deli meat. Dairy Low-fat (1%) or fat-free (skim) milk. Fat-free, low-fat, or reduced-fat cheeses. Nonfat, low-sodium ricotta or cottage cheese. Low-fat or nonfat yogurt. Low-fat, low-sodium cheese. Fats and oils Soft margarine without trans fats. Vegetable oil. Low-fat, reduced-fat, or light mayonnaise and salad dressings (reduced-sodium). Canola, safflower, olive, soybean, and sunflower oils. Avocado. Seasoning and other foods Herbs. Spices. Seasoning mixes without salt. Unsalted popcorn and pretzels. Fat-free sweets. What foods are not recommended? The items listed may not be a complete list. Talk with your dietitian about what dietary choices are best for you. Grains Baked goods made with fat, such as croissants, muffins, or some breads. Dry pasta or rice meal packs. Vegetables Creamed or fried vegetables. Vegetables in a cheese sauce. Regular canned vegetables (not low-sodium or reduced-sodium). Regular canned tomato sauce and paste (not low-sodium or reduced-sodium). Regular tomato and vegetable juice (not low-sodium or reduced-sodium). Pickles. Olives. Fruits Canned fruit in a light or heavy syrup. Fried fruit. Fruit in cream or butter sauce. Meat and other protein foods Fatty cuts of meat. Ribs. Fried meat. Bacon. Sausage. Bologna and other processed lunch meats. Salami. Fatback. Hotdogs. Bratwurst. Salted nuts and seeds. Canned beans with added salt. Canned or smoked fish. Whole eggs or egg yolks. Chicken or turkey with skin. Dairy Whole or 2% milk, cream, and half-and-half. Whole or full-fat cream cheese. Whole-fat or sweetened yogurt. Full-fat cheese. Nondairy creamers. Whipped toppings.  Processed cheese and cheese spreads. Fats and oils Butter. Stick margarine. Lard. Shortening. Ghee. Bacon fat. Tropical oils, such as coconut, palm kernel, or palm oil. Seasoning and other foods Salted popcorn and pretzels. Onion salt, garlic salt, seasoned salt, table salt, and sea salt. Worcestershire sauce. Tartar sauce. Barbecue sauce. Teriyaki sauce. Soy sauce, including reduced-sodium. Steak sauce. Canned and packaged gravies. Fish sauce. Oyster sauce. Cocktail sauce. Horseradish that you find on the shelf. Ketchup. Mustard. Meat flavorings and tenderizers. Bouillon cubes. Hot sauce and Tabasco sauce. Premade or packaged marinades. Premade or packaged taco seasonings. Relishes. Regular salad dressings. Where to find more information:  National Heart, Lung, and Blood Institute: www.nhlbi.nih.gov  American Heart Association: www.heart.org Summary  The DASH eating plan is a healthy eating plan that has been shown to reduce high blood pressure (hypertension). It may also reduce your risk for type 2 diabetes, heart disease, and stroke.  With the DASH eating plan, you should limit salt (sodium) intake to 2,300 mg a day. If you have hypertension, you may need to reduce your sodium intake to 1,500 mg a day.  When on the DASH eating plan, aim to eat more fresh fruits and vegetables, whole grains, lean proteins, low-fat dairy, and heart-healthy fats.  Work with your health care provider or diet and nutrition specialist (dietitian) to adjust your eating plan to your individual   calorie needs. This information is not intended to replace advice given to you by your health care provider. Make sure you discuss any questions you have with your health care provider. Document Released: 05/26/2011 Document Revised: 05/30/2016 Document Reviewed: 05/30/2016 Elsevier Interactive Patient Education  2018 [employer] Inc.  

## 2018-04-26 NOTE — Progress Notes (Signed)
Subjective:   Lauren Berry is a 67 y.o. female who presents for Medicare Annual (Subsequent) preventive examination.  Review of Systems:  n/a Cardiac Risk Factors include: advanced age (>43men, >56 women);hypertension;obesity (BMI >30kg/m2)     Objective:     Vitals: BP (!) 144/82 (BP Location: Left Arm)   Pulse 83   Temp 98.3 F (36.8 C)   Ht 5\' 2"  (1.575 m)   Wt 277 lb (125.6 kg)   SpO2 97%   BMI 50.66 kg/m   Body mass index is 50.66 kg/m.  Advanced Directives 04/26/2018 10/03/2011  Does Patient Have a Medical Advance Directive? Yes Patient does not have advance directive  Type of Advance Directive Healthcare Power of [place];Living will -  Does patient want to make changes to medical advance directive? No - Patient declined -  Copy of Healthcare Power of Attorney in Chart? No - copy requested -    Tobacco Social History   Tobacco Use  Smoking Status Never Smoker  Smokeless Tobacco Never Used     Counseling given: Not Answered   Clinical Intake:  Pre-visit preparation completed: Yes  Pain : No/denies pain     Nutritional Status: BMI > 30  Obese Diabetes: No  How often do you need to have someone help you when you read instructions, pamphlets, or other written materials from your doctor or pharmacy?: 1 - Never What is the last grade level you completed in school?: Paralegal  Interpreter Needed?: No  Information entered by :: NAllen LPN  Past Medical History:  Diagnosis Date  . Anemia   . Blood transfusion   . Cataract   . Diverticulosis   . Hemorrhoids internal and external  . Hypertension    Past Surgical History:  Procedure Laterality Date  . CESAREAN SECTION  1976 and 1982  . COLONOSCOPY  10/03/2011   Procedure: COLONOSCOPY;  Surgeon: Charna Elizabeth, MD;  Location: [facility];  Service: Endoscopy;  Laterality: N/A;  . EYE SURGERY  2001   bilateral cataract   History reviewed. No pertinent family history. Social History    Socioeconomic History  . Marital status: Married    Spouse name: Not on file  . Number of children: Not on file  . Years of education: Not on file  . Highest education level: Not on file  Occupational History  . Occupation: retired  Engineer, production  . Financial resource strain: Not hard at all  . Food insecurity:    Worry: Never true    Inability: Never true  . Transportation needs:    Medical: No    Non-medical: No  Tobacco Use  . Smoking status: Never Smoker  . Smokeless tobacco: Never Used  Substance and Sexual Activity  . Alcohol use: No  . Drug use: No  . Sexual activity: Not Currently  Lifestyle  . Physical activity:    Days per week: 5 days    Minutes per session: 120 min  . Stress: Not at all  Relationships  . Social connections:    Talks on phone: Not on file    Gets together: Not on file    Attends religious service: Not on file    Active member of club or organization: Not on file    Attends meetings of clubs or organizations: Not on file    Relationship status: Not on file  Other Topics Concern  . Not on file  Social History Narrative  . Not on file    Outpatient Encounter Medications as  of 04/26/2018  Medication Sig  . Ascorbic Acid (VITAMIN C) 100 MG tablet Take 100 mg by mouth daily.  . cholecalciferol (VITAMIN D) 1000 UNITS tablet Take 1,000 Units by mouth daily.  . Olmesartan-Amlodipine-HCTZ (TRIBENZOR PO) Take by mouth.  . cyanocobalamin 100 MCG tablet Take 100 mcg by mouth daily.   No facility-administered encounter medications on file as of 04/26/2018.     Activities of Daily Living In your present state of health, do you have any difficulty performing the following activities: 04/26/2018  Hearing? N  Vision? N  Difficulty concentrating or making decisions? N  Walking or climbing stairs? N  Dressing or bathing? N  Doing errands, shopping? N  Preparing Food and eating ? N  Using the Toilet? N  In the past six months, have you accidently  leaked urine? N  Do you have problems with loss of bowel control? N  Managing your Medications? N  Managing your Finances? N  Housekeeping or managing your Housekeeping? N  Some recent data might be hidden    Patient Care Team: Dorothyann Peng, MD as PCP - General (Internal Medicine) Davina P[facility])    Assessment:   This is a routine wellness examination for Lauren Berry.  Exercise Activities and Dietary recommendations Current Exercise Habits: Structured exercise class, Type of exercise: calisthenics;strength training/weights;Other - see comments;yoga(rides bicycle), Time (Minutes): > 60, Frequency (Times/Week): 5, Weekly Exercise (Minutes/Week): 0, Intensity: Moderate, Exercise limited by: None identified  Goals    . Weight (lb) < 200 lb (90.7 kg) (pt-stated)     Wants to lose 77 pounds       Fall Risk Fall Risk  04/26/2018  Falls in the past year? 0  Risk for fall due to : Medication side effect   Is the patient's home free of loose throw rugs in walkways, pet beds, electrical cords, etc?   yes      Grab bars in the bathroom? no      Handrails on the stairs?   n/a      Adequate lighting?   yes  Timed Get Up and Go performed: n/a   Depression Screen PHQ 2/9 Scores 04/26/2018  PHQ - 2 Score 0  PHQ- 9 Score 0     Cognitive Function     6CIT Screen 04/26/2018  What Year? 0 points  What month? 0 points  What time? 0 points  Count back from 20 0 points  Months in reverse 0 points  Repeat phrase 0 points  Total Score 0    Immunization History  Administered Date(s) Administered  . Influenza, High Dose Seasonal PF 03/23/2018  . Zoster Recombinat (Shingrix) 03/23/2018    Qualifies for Shingles Vaccine? Yes   Screening Tests Health Maintenance  Topic Date Due  . Hepatitis C Screening  12-08-1950  . PNA vac Low Risk Adult (1 of 2 - PCV13) 10/28/2015  . MAMMOGRAM  07/08/2019  . COLONOSCOPY  10/02/2021  . TETANUS/TDAP  05/23/2023  . INFLUENZA VACCINE   Completed  . DEXA SCAN  Completed    Cancer Screenings: Lung: Low Dose CT Chest recommended if Age 2-80 years, 30 pack-year currently smoking OR have quit w/in 15years. Patient does not qualify. Breast:  Up to date on Mammogram? Yes   Up to date of Bone Density/Dexa? Yes Colorectal: up to date  Additional Screenings: : Hepatitis C Screening: n/a     Plan:   Patient has a goal to lose 77 pounds.  Has been exercising regularly  I have personally reviewed and noted the following in the patient's chart:   . Medical and social history . Use of alcohol, tobacco or illicit drugs  . Current medications and supplements . Functional ability and status . Nutritional status . Physical activity . Advanced directives . List of other physicians . Hospitalizations, surgeries, and ER visits in previous 12 months . Vitals . Screenings to include cognitive, depression, and falls . Referrals and appointments  In addition, I have reviewed and discussed with patient certain preventive protocols, quality metrics, and best practice recommendations. A written personalized care plan for preventive services as well as general preventive health recommendations were provided to patient.     Barb Merino, LPN  14/12/8293

## 2018-04-26 NOTE — Progress Notes (Signed)
Subjective:     Patient ID: Lauren Berry , female    DOB: 01-11-1951 , 67 y.o.   MRN: [id number]   Chief Complaint  Patient presents with  . Hypertension    HPI  Hypertension  This is a chronic problem. The current episode started more than 1 month ago. The problem is unchanged. The problem is uncontrolled. Pertinent negatives include no blurred vision, chest pain, headaches, orthopnea or shortness of breath. Risk factors for coronary artery disease include dyslipidemia, post-menopausal state and obesity. The current treatment provides moderate improvement. Compliance problems include diet.      Past Medical History:  Diagnosis Date  . Anemia   . Blood transfusion   . Cataract   . Diverticulosis   . Hemorrhoids internal and external  . Hypertension      No family history on file.   Current Outpatient Medications:  .  Ascorbic Acid (VITAMIN C) 100 MG tablet, Take 100 mg by mouth daily., Disp: , Rfl:  .  cholecalciferol (VITAMIN D) 1000 UNITS tablet, Take 1,000 Units by mouth daily., Disp: , Rfl:  .  cyanocobalamin 100 MCG tablet, Take 100 mcg by mouth daily., Disp: , Rfl:  .  Olmesartan-Amlodipine-HCTZ (TRIBENZOR PO), Take by mouth., Disp: , Rfl:    Allergies  Allergen Reactions  . Penicillins      Review of Systems  Constitutional: Negative.   Eyes: Negative for blurred vision.  Respiratory: Negative.  Negative for shortness of breath.   Cardiovascular: Negative.  Negative for chest pain and orthopnea.  Gastrointestinal: Negative.   Genitourinary: Negative.   Neurological: Negative.  Negative for headaches.  Psychiatric/Behavioral: Negative.      Today's Vitals   04/26/18 0907  BP: (!) 144/82  Pulse: 83  Temp: 98.3 F (36.8 C)  TempSrc: Oral  Weight: 277 lb (125.6 kg)  Height: '5\' 2"'  (1.575 m)   Body mass index is 50.66 kg/m.   Objective:  Physical Exam  Constitutional: She is oriented to person, place, and time. She appears well-developed and  well-nourished.  Morbidly obese  HENT:  Head: Normocephalic and atraumatic.  Eyes: EOM are normal.  Cardiovascular: Normal rate, regular rhythm and normal heart sounds.  Pulmonary/Chest: Effort normal and breath sounds normal.  Neurological: She is alert and oriented to person, place, and time.  Psychiatric: She has a normal mood and affect.  Nursing note and vitals reviewed.       Assessment And Plan:     1. Essential hypertension, benign  Fair control. She will continue with current meds for now. She is encouraged to avoid processed foods including deli meats, sausages, bacon and packaged foods which tend to have more sodium. She is also encouraged to avoid adding salt to her foods. She will rto in 3 months for re-evaluation.   - CMP14+EGFR - Lipid Profile  2. Other abnormal glucose  HER A1C HAS BEEN ELEVATED IN THE PAST. I WILL CHECK AN A1C, BMET TODAY. SHE WAS ENCOURAGED TO AVOID SUGARY BEVERAGES AND PROCESSED FOODS INCLUDNG BREADS, RICE AND PASTA.  - Hemoglobin A1c  3. Family history of colon cancer  Her brother was recently diagnosed with colon cancer at age 68. Her last colonoscopy was in 2013, but with poor prep. I will refer her to GI for CRC screening. She does not wish to go to previous provider.   4. Class 3 severe obesity due to excess calories with serious comorbidity and body mass index (BMI) of 50.0 to 59.9 in adult [facility])  She is encouraged to strive to lose 10 percent of her body weight to decrease cardiac risk. She is also encouraged to avoid sugary beverages and processed foods. She will continue to exercise no less than four days weekly. She will rto in 3 months for re-evaluation.   5. Colon cancer screening  - Ambulatory referral to Gastroenterology   Maximino Greenland, MD

## 2018-04-26 NOTE — Patient Instructions (Signed)
Lauren Berry , Thank you for taking time to come for your Medicare Wellness Visit. I appreciate your ongoing commitment to your health goals. Please review the following plan we discussed and let me know if I can assist you in the future.   Screening recommendations/referrals: Colonoscopy: 05/2012 Mammogram: 06/2017 Bone Density: 12/2016 Recommended yearly ophthalmology/optometry visit for glaucoma screening and checkup Recommended yearly dental visit for hygiene and checkup  Vaccinations: Influenza vaccine: 03/2018 Pneumococcal vaccine: prevnar 13 sent to pharmacy Tdap vaccine: 05/2013 Shingles vaccine: 03/2018    Advanced directives: Please bring a copy of your POA (Power of [place]) and/or Living Will to your next appointment.    Conditions/risks identified: Obesity: Exercising 5 times a week. Goal to lose 77 pounds  Next appointment: 04/26/2018   Preventive Care 65 Years and Older, Female Preventive care refers to lifestyle choices and visits with your health care provider that can promote health and wellness. What does preventive care include?  A yearly physical exam. This is also called an annual well check.  Dental exams once or twice a year.  Routine eye exams. Ask your health care provider how often you should have your eyes checked.  Personal lifestyle choices, including:  Daily care of your teeth and gums.  Regular physical activity.  Eating a healthy diet.  Avoiding tobacco and drug use.  Limiting alcohol use.  Practicing safe sex.  Taking low-dose aspirin every day.  Taking vitamin and mineral supplements as recommended by your health care provider. What happens during an annual well check? The services and screenings done by your health care provider during your annual well check will depend on your age, overall health, lifestyle risk factors, and family history of disease. Counseling  Your health care provider may ask you questions about  your:  Alcohol use.  Tobacco use.  Drug use.  Emotional well-being.  Home and relationship well-being.  Sexual activity.  Eating habits.  History of falls.  Memory and ability to understand (cognition).  Work and work Astronomer.  Reproductive health. Screening  You may have the following tests or measurements:  Height, weight, and BMI.  Blood pressure.  Lipid and cholesterol levels. These may be checked every 5 years, or more frequently if you are over 62 years old.  Skin check.  Lung cancer screening. You may have this screening every year starting at age 46 if you have a 30-pack-year history of smoking and currently smoke or have quit within the past 15 years.  Fecal occult blood test (FOBT) of the stool. You may have this test every year starting at age 52.  Flexible sigmoidoscopy or colonoscopy. You may have a sigmoidoscopy every 5 years or a colonoscopy every 10 years starting at age 61.  Hepatitis C blood test.  Hepatitis B blood test.  Sexually transmitted disease (STD) testing.  Diabetes screening. This is done by checking your blood sugar (glucose) after you have not eaten for a while (fasting). You may have this done every 1-3 years.  Bone density scan. This is done to screen for osteoporosis. You may have this done starting at age 43.  Mammogram. This may be done every 1-2 years. Talk to your health care provider about how often you should have regular mammograms. Talk with your health care provider about your test results, treatment options, and if necessary, the need for more tests. Vaccines  Your health care provider may recommend certain vaccines, such as:  Influenza vaccine. This is recommended every year.  Tetanus, diphtheria,  and acellular pertussis (Tdap, Td) vaccine. You may need a Td booster every 10 years.  Zoster vaccine. You may need this after age 95.  Pneumococcal 13-valent conjugate (PCV13) vaccine. One dose is recommended  after age 56.  Pneumococcal polysaccharide (PPSV23) vaccine. One dose is recommended after age 57. Talk to your health care provider about which screenings and vaccines you need and how often you need them. This information is not intended to replace advice given to you by your health care provider. Make sure you discuss any questions you have with your health care provider. Document Released: 07/03/2015 Document Revised: 02/24/2016 Document Reviewed: 04/07/2015 Elsevier Interactive Patient Education  2017 [employer] Prevention in the Home Falls can cause injuries. They can happen to people of all ages. There are many things you can do to make your home safe and to help prevent falls. What can I do on the outside of my home?  Regularly fix the edges of walkways and driveways and fix any cracks.  Remove anything that might make you trip as you walk through a door, such as a raised step or threshold.  Trim any bushes or trees on the path to your home.  Use bright outdoor lighting.  Clear any walking paths of anything that might make someone trip, such as rocks or tools.  Regularly check to see if handrails are loose or broken. Make sure that both sides of any steps have handrails.  Any raised decks and porches should have guardrails on the edges.  Have any leaves, snow, or ice cleared regularly.  Use sand or salt on walking paths during winter.  Clean up any spills in your garage right away. This includes oil or grease spills. What can I do in the bathroom?  Use night lights.  Install grab bars by the toilet and in the tub and shower. Do not use towel bars as grab bars.  Use non-skid mats or decals in the tub or shower.  If you need to sit down in the shower, use a plastic, non-slip stool.  Keep the floor dry. Clean up any water that spills on the floor as soon as it happens.  Remove soap buildup in the tub or shower regularly.  Attach bath mats securely with  double-sided non-slip rug tape.  Do not have throw rugs and other things on the floor that can make you trip. What can I do in the bedroom?  Use night lights.  Make sure that you have a light by your bed that is easy to reach.  Do not use any sheets or blankets that are too big for your bed. They should not hang down onto the floor.  Have a firm chair that has side arms. You can use this for support while you get dressed.  Do not have throw rugs and other things on the floor that can make you trip. What can I do in the kitchen?  Clean up any spills right away.  Avoid walking on wet floors.  Keep items that you use a lot in easy-to-reach places.  If you need to reach something above you, use a strong step stool that has a grab bar.  Keep electrical cords out of the way.  Do not use floor polish or wax that makes floors slippery. If you must use wax, use non-skid floor wax.  Do not have throw rugs and other things on the floor that can make you trip. What can I do with my  stairs?  Do not leave any items on the stairs.  Make sure that there are handrails on both sides of the stairs and use them. Fix handrails that are broken or loose. Make sure that handrails are as long as the stairways.  Check any carpeting to make sure that it is firmly attached to the stairs. Fix any carpet that is loose or worn.  Avoid having throw rugs at the top or bottom of the stairs. If you do have throw rugs, attach them to the floor with carpet tape.  Make sure that you have a light switch at the top of the stairs and the bottom of the stairs. If you do not have them, ask someone to add them for you. What else can I do to help prevent falls?  Wear shoes that:  Do not have high heels.  Have rubber bottoms.  Are comfortable and fit you well.  Are closed at the toe. Do not wear sandals.  If you use a stepladder:  Make sure that it is fully opened. Do not climb a closed stepladder.  Make  sure that both sides of the stepladder are locked into place.  Ask someone to hold it for you, if possible.  Clearly mark and make sure that you can see:  Any grab bars or handrails.  First and last steps.  Where the edge of each step is.  Use tools that help you move around (mobility aids) if they are needed. These include:  Canes.  Walkers.  Scooters.  Crutches.  Turn on the lights when you go into a dark area. Replace any light bulbs as soon as they burn out.  Set up your furniture so you have a clear path. Avoid moving your furniture around.  If any of your floors are uneven, fix them.  If there are any pets around you, be aware of where they are.  Review your medicines with your doctor. Some medicines can make you feel dizzy. This can increase your chance of falling. Ask your doctor what other things that you can do to help prevent falls. This information is not intended to replace advice given to you by your health care provider. Make sure you discuss any questions you have with your health care provider. Document Released: 04/02/2009 Document Revised: 11/12/2015 Document Reviewed: 07/11/2014 Elsevier Interactive Patient Education  2017 [employer].

## 2018-04-27 LAB — HEMOGLOBIN A1C
Est. average glucose Bld gHb Est-mCnc: 126 mg/dL
Hgb A1c MFr Bld: 6 % — ABNORMAL HIGH (ref 4.8–5.6)

## 2018-04-27 LAB — CMP14+EGFR
ALT: 17 IU/L (ref 0–32)
AST: 16 IU/L (ref 0–40)
Albumin/Globulin Ratio: 1.2 (ref 1.2–2.2)
Albumin: 4.1 g/dL (ref 3.6–4.8)
Alkaline Phosphatase: 95 IU/L (ref 39–117)
BUN/Creatinine Ratio: 13 (ref 12–28)
BUN: 11 mg/dL (ref 8–27)
Bilirubin Total: 0.3 mg/dL (ref 0.0–1.2)
CO2: 25 mmol/L (ref 20–29)
Calcium: 9.5 mg/dL (ref 8.7–10.3)
Chloride: 97 mmol/L (ref 96–106)
Creatinine, Ser: 0.86 mg/dL (ref 0.57–1.00)
GFR calc Af Amer: 81 mL/min/{1.73_m2} (ref >59)
GFR calc non Af Amer: 70 mL/min/{1.73_m2} (ref >59)
Globulin, Total: 3.4 g/dL (ref 1.5–4.5)
Glucose: 98 mg/dL (ref 65–99)
Potassium: 4.2 mmol/L (ref 3.5–5.2)
Sodium: 139 mmol/L (ref 134–144)
Total Protein: 7.5 g/dL (ref 6.0–8.5)

## 2018-04-27 LAB — LIPID PANEL
Chol/HDL Ratio: 2.6 ratio (ref 0.0–4.4)
Cholesterol, Total: 183 mg/dL (ref 100–199)
HDL: 71 mg/dL (ref >39)
LDL Calculated: 103 mg/dL — ABNORMAL HIGH (ref 0–99)
Triglycerides: 45 mg/dL (ref 0–149)
VLDL Cholesterol Cal: 9 mg/dL (ref 5–40)

## 2018-04-29 NOTE — Progress Notes (Signed)
Your liver and kidney fxn are stable. Your ldl, bad chol is not bad -- it is 103, goal is less than 100.  You are prediabetic with a1c 6.0. Be sure to exercise no less than five days weekly for 30 minutes and make healthy eating choices. See you next time!

## 2018-05-25 ENCOUNTER — Encounter: Payer: Self-pay | Admitting: Internal Medicine

## 2018-06-21 ENCOUNTER — Other Ambulatory Visit: Payer: Self-pay | Admitting: Internal Medicine

## 2018-06-21 DIAGNOSIS — Z1231 Encounter for screening mammogram for malignant neoplasm of breast: Secondary | ICD-10-CM

## 2018-07-08 ENCOUNTER — Other Ambulatory Visit: Payer: Self-pay | Admitting: Internal Medicine

## 2018-07-20 ENCOUNTER — Ambulatory Visit
Admission: RE | Admit: 2018-07-20 | Discharge: 2018-07-20 | Disposition: A | Discharge status: Home / Self Care | Payer: Medicare Other | Source: Ambulatory Visit | Attending: Internal Medicine | Admitting: Internal Medicine

## 2018-07-20 ENCOUNTER — Other Ambulatory Visit: Payer: Self-pay | Admitting: Internal Medicine

## 2018-07-20 DIAGNOSIS — Z1231 Encounter for screening mammogram for malignant neoplasm of breast: Secondary | ICD-10-CM

## 2018-07-30 ENCOUNTER — Encounter: Payer: Self-pay | Admitting: Internal Medicine

## 2018-07-30 ENCOUNTER — Ambulatory Visit (INDEPENDENT_AMBULATORY_CARE_PROVIDER_SITE_OTHER): Payer: Medicare Other | Admitting: Internal Medicine

## 2018-07-30 VITALS — BP 122/74 | HR 80 | Temp 98.0°F | Ht 61.6 in | Wt 266.8 lb

## 2018-07-30 DIAGNOSIS — I10 Essential (primary) hypertension: Secondary | ICD-10-CM | POA: Diagnosis not present

## 2018-07-30 DIAGNOSIS — Z6841 Body Mass Index (BMI) 40.0 and over, adult: Secondary | ICD-10-CM | POA: Diagnosis not present

## 2018-07-30 DIAGNOSIS — R7309 Other abnormal glucose: Secondary | ICD-10-CM | POA: Diagnosis not present

## 2018-07-30 MED ORDER — OLMESARTAN-AMLODIPINE-HCTZ 40-10-12.5 MG PO TABS: 1.0000 | ORAL_TABLET | Freq: Every day | ORAL | 1 refills | Status: DC

## 2018-07-30 NOTE — Progress Notes (Signed)
Subjective:     Patient ID: Lauren Berry , female    DOB: 1950/07/15 , 68 y.o.   MRN: [id number]   Chief Complaint  Patient presents with  . Hypertension    HPI  Hypertension  This is a chronic problem. The current episode started more than 1 year ago. The problem has been gradually improving since onset. The problem is controlled. Pertinent negatives include no blurred vision, chest pain, palpitations or shortness of breath. Risk factors for coronary artery disease include dyslipidemia, obesity and post-menopausal state.     Past Medical History:  Diagnosis Date  . Anemia   . Blood transfusion   . Cataract   . Diverticulosis   . Hemorrhoids internal and external  . Hypertension      Family History  Problem Relation Age of Onset  . Breast cancer Mother 72  . Diabetes Father      Current Outpatient Medications:  .  Ascorbic Acid (VITAMIN C) 100 MG tablet, Take 100 mg by mouth daily., Disp: , Rfl:  .  cholecalciferol (VITAMIN D) 1000 UNITS tablet, Take 1,000 Units by mouth daily., Disp: , Rfl:  .  cyanocobalamin 100 MCG tablet, Take 100 mcg by mouth daily., Disp: , Rfl:  .  Olmesartan-amLODIPine-HCTZ 40-10-12.5 MG TABS, TAKE 1 TABLET BY MOUTH EVERY DAY, Disp: 90 tablet, Rfl: 1   Allergies  Allergen Reactions  . Penicillins      Review of Systems  Constitutional: Negative.   Eyes: Negative for blurred vision.  Respiratory: Negative.  Negative for shortness of breath.   Cardiovascular: Negative.  Negative for chest pain and palpitations.  Gastrointestinal: Negative.   Neurological: Negative.   Psychiatric/Behavioral: Negative.      Today's Vitals   07/30/18 1007  BP: 122/74  Pulse: 80  Temp: 98 F (36.7 C)  TempSrc: Oral  Weight: 266 lb 12.8 oz (121 kg)  Height: 5' 1.6" (1.565 m)   Body mass index is 49.43 kg/m.   Objective:  Physical Exam Vitals signs and nursing note reviewed.  Constitutional:      Appearance: Normal appearance.  HENT:   Head: Normocephalic and atraumatic.  Cardiovascular:     Rate and Rhythm: Normal rate and regular rhythm.     Heart sounds: Normal heart sounds.  Pulmonary:     Effort: Pulmonary effort is normal.     Breath sounds: Normal breath sounds.  Skin:    General: Skin is warm.  Neurological:     General: No focal deficit present.     Mental Status: She is alert.  Psychiatric:        Mood and Affect: Mood normal.         Assessment And Plan:     1. Essential hypertension, benign  Well controlled. She will continue with current meds.  She is encouraged to avoid adding salt to her foods. She will rto in four months for re-evaluation.   2. Other abnormal glucose  HER A1C HAS BEEN ELEVATED IN THE PAST. I WILL CHECK AN A1C, BMET TODAY. SHE WAS ENCOURAGED TO AVOID SUGARY BEVERAGES AND PROCESSED FOODS INCLUDNG BREADS, RICE AND PASTA.  3. Class 3 severe obesity due to excess calories with serious comorbidity and body mass index (BMI) of 45.0 to 49.9 in adult [facility])  She was congratulated on her 11 pound weight loss.  She has now joined [employer]. She enjoys the program.  She is encouraged to keep up the great work!   Gwynneth Aliment, MD

## 2018-07-30 NOTE — Patient Instructions (Signed)

## 2018-07-31 LAB — BMP8+EGFR
BUN/Creatinine Ratio: 23 (ref 12–28)
BUN: 16 mg/dL (ref 8–27)
CO2: 26 mmol/L (ref 20–29)
Calcium: 10.1 mg/dL (ref 8.7–10.3)
Chloride: 98 mmol/L (ref 96–106)
Creatinine, Ser: 0.69 mg/dL (ref 0.57–1.00)
GFR calc Af Amer: 104 mL/min/{1.73_m2} (ref >59)
GFR calc non Af Amer: 90 mL/min/{1.73_m2} (ref >59)
Glucose: 98 mg/dL (ref 65–99)
Potassium: 4.9 mmol/L (ref 3.5–5.2)
Sodium: 141 mmol/L (ref 134–144)

## 2018-07-31 LAB — HEMOGLOBIN A1C
Est. average glucose Bld gHb Est-mCnc: 126 mg/dL
Hgb A1c MFr Bld: 6 % — ABNORMAL HIGH (ref 4.8–5.6)

## 2018-11-26 ENCOUNTER — Ambulatory Visit: Payer: Medicare Other | Admitting: Internal Medicine

## 2018-11-27 ENCOUNTER — Other Ambulatory Visit: Payer: Self-pay

## 2018-11-27 ENCOUNTER — Ambulatory Visit (INDEPENDENT_AMBULATORY_CARE_PROVIDER_SITE_OTHER): Payer: Medicare Other | Admitting: Internal Medicine

## 2018-11-27 ENCOUNTER — Encounter: Payer: Self-pay | Admitting: Internal Medicine

## 2018-11-27 VITALS — BP 113/88 | HR 76 | Temp 97.7°F | Ht 61.6 in | Wt 230.0 lb

## 2018-11-27 DIAGNOSIS — R7309 Other abnormal glucose: Secondary | ICD-10-CM | POA: Diagnosis not present

## 2018-11-27 DIAGNOSIS — I10 Essential (primary) hypertension: Secondary | ICD-10-CM

## 2018-11-27 DIAGNOSIS — Z6841 Body Mass Index (BMI) 40.0 and over, adult: Secondary | ICD-10-CM

## 2018-11-27 NOTE — Patient Instructions (Signed)

## 2018-12-05 ENCOUNTER — Other Ambulatory Visit: Payer: Medicare Other

## 2018-12-05 ENCOUNTER — Ambulatory Visit: Payer: Medicare Other

## 2018-12-05 ENCOUNTER — Other Ambulatory Visit: Payer: Self-pay

## 2018-12-05 VITALS — Temp 98.3°F | Ht 61.4 in | Wt 230.6 lb

## 2018-12-05 DIAGNOSIS — I10 Essential (primary) hypertension: Secondary | ICD-10-CM

## 2018-12-05 DIAGNOSIS — R7309 Other abnormal glucose: Secondary | ICD-10-CM

## 2018-12-05 NOTE — Progress Notes (Signed)
Patient came in to have orthostatic vitals taken. Encouraged to call the office for any concerns

## 2018-12-06 LAB — TSH: TSH: 2.56 u[IU]/mL (ref 0.450–4.500)

## 2018-12-06 LAB — BMP8+EGFR
BUN/Creatinine Ratio: 19 (ref 12–28)
BUN: 14 mg/dL (ref 8–27)
CO2: 25 mmol/L (ref 20–29)
Calcium: 9.8 mg/dL (ref 8.7–10.3)
Chloride: 103 mmol/L (ref 96–106)
Creatinine, Ser: 0.75 mg/dL (ref 0.57–1.00)
GFR calc Af Amer: 95 mL/min/{1.73_m2} (ref >59)
GFR calc non Af Amer: 82 mL/min/{1.73_m2} (ref >59)
Glucose: 94 mg/dL (ref 65–99)
Potassium: 4 mmol/L (ref 3.5–5.2)
Sodium: 139 mmol/L (ref 134–144)

## 2018-12-06 LAB — HEMOGLOBIN A1C
Est. average glucose Bld gHb Est-mCnc: 117 mg/dL
Hgb A1c MFr Bld: 5.7 % — ABNORMAL HIGH (ref 4.8–5.6)

## 2018-12-29 NOTE — Progress Notes (Addendum)
Virtual Visit via Video   This visit type was conducted due to national recommendations for restrictions regarding the COVID-19 Pandemic (e.g. social distancing) in an effort to limit this patient's exposure and mitigate transmission in our community.  Due to her co-morbid illnesses, this patient is at least at moderate risk for complications without adequate follow up.  This format is felt to be most appropriate for this patient at this time.  All issues noted in this document were discussed and addressed.  A limited physical exam was performed with this format.    This visit type was conducted due to national recommendations for restrictions regarding the COVID-19 Pandemic (e.g. social distancing) in an effort to limit this patient's exposure and mitigate transmission in our community.  Patients identity confirmed using two different identifiers.  This format is felt to be most appropriate for this patient at this time.  All issues noted in this document were discussed and addressed.  No physical exam was performed (except for noted visual exam findings with Video Visits).    Date:  12/29/2018   ID:  Lauren Berry, DOB 17-May-1951, MRN [id number]  Patient Location:  Home  Provider location:   Office    Chief Complaint:  It is time for my BP check.   History of Present Illness:    Lauren Berry is a 68 y.o. female who presents via video conferencing for a telehealth visit today.     The patient does not have symptoms concerning for COVID-19 infection (fever, chills, cough, or new shortness of breath).   She presents today for virtual visit. She prefers this method of contact due to COVID-19 pandemic.  She reports she has been following the stay at home orders. She interacts with others minimally. Her daughter helps with grocery shopping. She reports compliance with meds. Denies having any chest pain, headaches and shortness of breath.   Hypertension This is a chronic problem. The  current episode started more than 1 year ago. The problem has been gradually improving since onset. The problem is controlled. Pertinent negatives include no blurred vision, palpitations or shortness of breath. Risk factors for coronary artery disease include obesity, post-menopausal state and sedentary lifestyle. Past treatments include angiotensin blockers, diuretics and calcium channel blockers. The current treatment provides moderate improvement.     Past Medical History:  Diagnosis Date  . Anemia   . Blood transfusion   . Cataract   . Diverticulosis   . Hemorrhoids internal and external  . Hypertension    Past Surgical History:  Procedure Laterality Date  . [place]  . COLONOSCOPY  10/03/2011   Procedure: COLONOSCOPY;  Surgeon: Juanita Craver, MD;  Location: [facility];  Service: Endoscopy;  Laterality: N/A;  . EYE SURGERY  2001   bilateral cataract     Current Meds  Medication Sig  . Ascorbic Acid (VITAMIN C) 100 MG tablet Take 100 mg by mouth daily.  . B Complex Vitamins (B COMPLEX 1 PO) Take by mouth.  . cholecalciferol (VITAMIN D) 1000 UNITS tablet Take 1,000 Units by mouth daily.  . Olmesartan-amLODIPine-HCTZ 40-10-12.5 MG TABS Take 1 tablet by mouth daily.  . [DISCONTINUED] cyanocobalamin 100 MCG tablet Take 100 mcg by mouth daily.     Allergies:   Penicillins   Social History   Tobacco Use  . Smoking status: Never Smoker  . Smokeless tobacco: Never Used  Substance Use Topics  . Alcohol use: No  . Drug use: No  Family Hx: The patient's family history includes Breast cancer (age of onset: 81) in her mother; Diabetes in her father.  ROS:   Please see the history of present illness.    Review of Systems  Constitutional: Negative.   Eyes: Negative for blurred vision.  Respiratory: Negative.  Negative for shortness of breath.   Cardiovascular: Negative.  Negative for palpitations.  Gastrointestinal: Negative.   Neurological: Negative.    Psychiatric/Behavioral: Negative.     All other systems reviewed and are negative.   Labs/Other Tests and Data Reviewed:    Recent Labs: 04/26/2018: ALT 17 12/05/2018: BUN 14; Creatinine, Ser 0.75; Potassium 4.0; Sodium 139; TSH 2.560   Recent Lipid Panel Lab Results  Component Value Date/Time   CHOL 183 04/26/2018 09:35 AM   TRIG 45 04/26/2018 09:35 AM   HDL 71 04/26/2018 09:35 AM   CHOLHDL 2.6 04/26/2018 09:35 AM   LDLCALC 103 (H) 04/26/2018 09:35 AM    Wt Readings from Last 3 Encounters:  12/05/18 230 lb 9.6 oz (104.6 kg)  11/27/18 230 lb (104.3 kg)  07/30/18 266 lb 12.8 oz (121 kg)     Exam:    Vital Signs:  BP 113/88 (BP Location: Left Arm, Patient Position: Sitting, Cuff Size: Normal) Comment: pt provided  Pulse 76 Comment: pt provided  Temp 97.7 F (36.5 C) (Oral) Comment: pt provided  Ht 5' 1.6" (1.565 m)   Wt 230 lb (104.3 kg) Comment: pt provided  BMI 42.62 kg/m     Physical Exam  Constitutional: She is oriented to person, place, and time and well-developed, well-nourished, and in no distress.  HENT:  Head: Normocephalic and atraumatic.  Neck: Normal range of motion.  Pulmonary/Chest: Effort normal.  Neurological: She is alert and oriented to person, place, and time.  Psychiatric: Affect normal.  Nursing note and vitals reviewed.   ASSESSMENT & PLAN:     1. Essential hypertension, benign  Chronic. Well controlled on current meds. She is encouraged to continue with current meds. She is encouraged to increase her daily activity. She agrees to come in on 6/17 for labwork. I will make further recommendations once her labs are available. Also congratulated on her lifestyle changes that have resulted in a 36 pound weight loss since Feb 2020. Pt advised that I will likely need to decrease her medication in the near future. Encouraged to notify me should she develop dizziness.   - BMP8+EGFR; Future - TSH; Future  2. Other abnormal glucose  HER A1C HAS  BEEN ELEVATED IN THE PAST. I WILL CHECK AN A1C, BMET TODAY. SHE WAS ENCOURAGED TO AVOID SUGARY BEVERAGES AND PROCESSED FOODS INCLUDNG BREADS, RICE AND PASTA.  - Hemoglobin A1c; Future  3. Class 3 severe obesity due to excess calories with serious comorbidity and body mass index (BMI) of 45.0 to 49.9 in adult [facility])  She was congratulated on her 36 pound weight loss since Feb 2020. She is encouraged to keep up the great work! She is advised to incorporate more exercise into her daily routine.     COVID-19 Education: The signs and symptoms of COVID-19 were discussed with the patient and how to seek care for testing (follow up with PCP or arrange E-visit).  The importance of social distancing was discussed today.  Patient Risk:   After full review of this patients clinical status, I feel that they are at least moderate risk at this time.  Time:   Today, I have spent 15 minutes/ 10 seconds with the patient  with telehealth technology discussing above diagnoses.     Medication Adjustments/Labs and Tests Ordered: Current medicines are reviewed at length with the patient today.  Concerns regarding medicines are outlined above.   Tests Ordered: Orders Placed This Encounter  Procedures  . BMP8+EGFR  . Hemoglobin A1c  . TSH    Medication Changes: No orders of the defined types were placed in this encounter.   Disposition:  Follow up in 4 month(s)  Signed, Maximino Greenland, MD

## 2019-01-22 ENCOUNTER — Other Ambulatory Visit: Payer: Self-pay | Admitting: Internal Medicine

## 2019-02-28 ENCOUNTER — Telehealth: Payer: Self-pay | Admitting: Orthopedic Surgery

## 2019-02-28 NOTE — Telephone Encounter (Signed)
Patient signed release form for records. I advised patient that no records are in Epic or in [facility]. She is adamant that she saw Dr Sharol Given several years ago. I explained to her that when we went electronic with [facility] in 2007 not every single paper chart got scanned in. Records were not retained more than 7 years at that time, and were destroyed.  She wanted a letter explaining said process. I told patient I could not state that her records were destroyed, only that we were unsuccessful at locating records on our current system or the old system [facility]. I typed up this letter on Word and mailed to patient. Copy of letter scanned in with auth she signed.

## 2019-03-04 ENCOUNTER — Other Ambulatory Visit: Payer: Self-pay | Admitting: Internal Medicine

## 2019-03-04 MED ORDER — OLMESARTAN-AMLODIPINE-HCTZ 40-10-12.5 MG PO TABS: 1.0000 | ORAL_TABLET | Freq: Every day | ORAL | 2 refills | Status: DC

## 2019-03-14 ENCOUNTER — Telehealth: Payer: Self-pay | Admitting: Orthopedic Surgery

## 2019-03-14 NOTE — Telephone Encounter (Signed)
Patient called stating she has not received the letter I mailed to her on 9/10. She was upset saying I never mailed it and I was being "Ify" with her. I explained to her I did mail the letter and mail delivery is out of my control and also there are a lot of issues with the [place] postal service currently. I offered if she would like to pickup a copy and she is going to do this. Copy of letter of no records found is ready at front desk.

## 2019-05-02 ENCOUNTER — Encounter: Payer: Self-pay | Admitting: Internal Medicine

## 2019-05-02 ENCOUNTER — Other Ambulatory Visit (HOSPITAL_COMMUNITY)
Admission: RE | Admit: 2019-05-02 | Discharge: 2019-05-02 | Disposition: A | Discharge status: Home / Self Care | Payer: Medicare Other | Source: Ambulatory Visit | Attending: Internal Medicine | Admitting: Internal Medicine

## 2019-05-02 ENCOUNTER — Other Ambulatory Visit: Payer: Self-pay

## 2019-05-02 ENCOUNTER — Ambulatory Visit (INDEPENDENT_AMBULATORY_CARE_PROVIDER_SITE_OTHER): Payer: Medicare Other | Admitting: Internal Medicine

## 2019-05-02 ENCOUNTER — Ambulatory Visit: Payer: Medicare HMO

## 2019-05-02 ENCOUNTER — Ambulatory Visit (INDEPENDENT_AMBULATORY_CARE_PROVIDER_SITE_OTHER): Payer: Medicare Other

## 2019-05-02 ENCOUNTER — Ambulatory Visit: Payer: Medicare HMO | Admitting: Internal Medicine

## 2019-05-02 VITALS — BP 118/76 | Temp 98.5°F | Ht 61.4 in | Wt 203.0 lb

## 2019-05-02 VITALS — BP 118/76 | HR 78 | Temp 98.5°F | Ht 61.4 in | Wt 203.6 lb

## 2019-05-02 DIAGNOSIS — Z23 Encounter for immunization: Secondary | ICD-10-CM | POA: Diagnosis not present

## 2019-05-02 DIAGNOSIS — Z0000 Encounter for general adult medical examination without abnormal findings: Secondary | ICD-10-CM | POA: Diagnosis not present

## 2019-05-02 DIAGNOSIS — R7309 Other abnormal glucose: Secondary | ICD-10-CM

## 2019-05-02 DIAGNOSIS — Z01419 Encounter for gynecological examination (general) (routine) without abnormal findings: Secondary | ICD-10-CM | POA: Diagnosis not present

## 2019-05-02 DIAGNOSIS — I10 Essential (primary) hypertension: Secondary | ICD-10-CM | POA: Diagnosis not present

## 2019-05-02 DIAGNOSIS — Z780 Asymptomatic menopausal state: Secondary | ICD-10-CM | POA: Insufficient documentation

## 2019-05-02 DIAGNOSIS — Z139 Encounter for screening, unspecified: Secondary | ICD-10-CM

## 2019-05-02 DIAGNOSIS — Z1151 Encounter for screening for human papillomavirus (HPV): Secondary | ICD-10-CM | POA: Diagnosis not present

## 2019-05-02 DIAGNOSIS — Z1211 Encounter for screening for malignant neoplasm of colon: Secondary | ICD-10-CM | POA: Diagnosis not present

## 2019-05-02 DIAGNOSIS — R202 Paresthesia of skin: Secondary | ICD-10-CM

## 2019-05-02 DIAGNOSIS — Z6837 Body mass index (BMI) 37.0-37.9, adult: Secondary | ICD-10-CM

## 2019-05-02 LAB — POCT URINALYSIS DIPSTICK
Bilirubin, UA: NEGATIVE
Blood, UA: NEGATIVE
Glucose, UA: NEGATIVE
Ketones, UA: NEGATIVE
Leukocytes, UA: NEGATIVE
Nitrite, UA: NEGATIVE
Protein, UA: NEGATIVE
Spec Grav, UA: 1.025 (ref 1.010–1.025)
Urobilinogen, UA: 0.2 E.U./dL
pH, UA: 6.5 (ref 5.0–8.0)

## 2019-05-02 LAB — POCT UA - MICROALBUMIN
Albumin/Creatinine Ratio, Urine, POC: <30
Creatinine, POC: 200 mg/dL
Microalbumin Ur, POC: 10 mg/L

## 2019-05-02 NOTE — Progress Notes (Signed)
Subjective:   Lauren Berry is a 68 y.o. female who presents for Medicare Annual (Subsequent) preventive examination.  Review of Systems:  n/a Cardiac Risk Factors include: advanced age (>49men, >45 women);hypertension;obesity (BMI >30kg/m2)     Objective:     Vitals: BP 118/76 (BP Location: Left Arm, Patient Position: Sitting, Cuff Size: Normal)   Temp 98.5 F (36.9 C) (Oral)   Ht 5' 1.4" (1.56 m)   Wt 203 lb (92.1 kg)   BMI 37.86 kg/m   Body mass index is 37.86 kg/m.  Advanced Directives 05/02/2019 04/26/2018 10/03/2011  Does Patient Have a Medical Advance Directive? Yes Yes Patient does not have advance directive  Type of Paramedic of [place];Living will -  Does patient want to make changes to medical advance directive? - No - Patient declined -  Copy of [place] in Chart? No - copy requested No - copy requested -    Tobacco Social History   Tobacco Use  Smoking Status Never Smoker  Smokeless Tobacco Never Used     Counseling given: Not Answered   Clinical Intake:  Pre-visit preparation completed: Yes  Pain : No/denies pain     Nutritional Status: BMI > 30  Obese Nutritional Risks: None Diabetes: No  How often do you need to have someone help you when you read instructions, pamphlets, or other written materials from your doctor or pharmacy?: 1 - Never What is the last grade level you completed in school?: college  Interpreter Needed?: No  Information entered by :: NAllen LPN  Past Medical History:  Diagnosis Date  . Anemia   . Blood transfusion   . Cataract   . Diverticulosis   . Hemorrhoids internal and external  . Hypertension    Past Surgical History:  Procedure Laterality Date  . [place]  . COLONOSCOPY  10/03/2011   Procedure: COLONOSCOPY;  Surgeon: Juanita Craver, MD;  Location: [facility];  Service: Endoscopy;  Laterality: N/A;  . EYE  SURGERY  2001   bilateral cataract   Family History  Problem Relation Age of Onset  . Breast cancer Mother 17  . Diabetes Father    Social History   Socioeconomic History  . Marital status: Married    Spouse name: Not on file  . Number of children: Not on file  . Years of education: Not on file  . Highest education level: Not on file  Occupational History  . Occupation: retired  Scientific laboratory technician  . Financial resource strain: Not hard at all  . Food insecurity    Worry: Never true    Inability: Never true  . Transportation needs    Medical: No    Non-medical: No  Tobacco Use  . Smoking status: Never Smoker  . Smokeless tobacco: Never Used  Substance and Sexual Activity  . Alcohol use: No  . Drug use: No  . Sexual activity: Not Currently  Lifestyle  . Physical activity    Days per week: 5 days    Minutes per session: 120 min  . Stress: Not at all  Relationships  . Social Herbalist on phone: Not on file    Gets together: Not on file    Attends religious service: Not on file    Active member of club or organization: Not on file    Attends meetings of clubs or organizations: Not on file    Relationship status: Not  on file  Other Topics Concern  . Not on file  Social History Narrative  . Not on file    Outpatient Encounter Medications as of 05/02/2019  Medication Sig  . Ascorbic Acid (VITAMIN C) 100 MG tablet Take 100 mg by mouth daily.  . B Complex Vitamins (B COMPLEX 1 PO) Take by mouth.  . cholecalciferol (VITAMIN D) 1000 UNITS tablet Take 1,000 Units by mouth daily.  . Olmesartan-amLODIPine-HCTZ 40-10-12.5 MG TABS Take 1 tablet by mouth daily.   No facility-administered encounter medications on file as of 05/02/2019.     Activities of Daily Living In your present state of health, do you have any difficulty performing the following activities: 05/02/2019 05/02/2019  Hearing? N N  Vision? N N  Difficulty concentrating or making decisions? N N   Walking or climbing stairs? N N  Dressing or bathing? N N  Doing errands, shopping? N N  Preparing Food and eating ? N -  Using the Toilet? N -  In the past six months, have you accidently leaked urine? Y -  Comment has a few times when held too long -  Do you have problems with loss of bowel control? N -  Managing your Medications? N -  Managing your Finances? N -  Housekeeping or managing your Housekeeping? N -  Some recent data might be hidden    Patient Care Team: Dorothyann PengSanders, Robyn, MD as PCP - General (Internal Medicine) Davina Pokeunn, [facility](Optometry)    Assessment:   This is a routine wellness examination for Lauren Berry.  Exercise Activities and Dietary recommendations Current Exercise Habits: Home exercise routine, Type of exercise: walking(stationary pedals), Time (Minutes): > 60, Frequency (Times/Week): 5, Weekly Exercise (Minutes/Week): 0  Goals    . Patient Stated     05/02/2019, complete losing 77 pounds already down 75 pounds    . Weight (lb) < 200 lb (90.7 kg) (pt-stated)     Wants to lose 77 pounds       Fall Risk Fall Risk  05/02/2019 05/02/2019 12/05/2018 11/27/2018 07/30/2018  Falls in the past year? 0 0 0 0 0  Number falls in past yr: 0 - - - -  Risk for fall due to : Medication side effect - - - -  Follow up Falls evaluation completed;Education provided;Falls prevention discussed - - - -   Is the patient's home free of loose throw rugs in walkways, pet beds, electrical cords, etc?   yes      Grab bars in the bathroom? no      Handrails on the stairs?   yes      Adequate lighting?   yes  Timed Get Up and Go performed: n/a  Depression Screen PHQ 2/9 Scores 05/02/2019 12/05/2018 11/27/2018 07/30/2018  PHQ - 2 Score 0 0 0 0  PHQ- 9 Score 3 - - -     Cognitive Function     6CIT Screen 05/02/2019 04/26/2018  What Year? 0 points 0 points  What month? 0 points 0 points  What time? 0 points 0 points  Count back from 20 0 points 0 points  Months in reverse 0 points  0 points  Repeat phrase 2 points 0 points  Total Score 2 0    Immunization History  Administered Date(s) Administered  . Influenza, High Dose Seasonal PF 03/23/2018  . Zoster Recombinat (Shingrix) 03/23/2018    Qualifies for Shingles Vaccine? yes  Screening Tests Health Maintenance  Topic Date Due  . PNA vac  Low Risk Adult (2 of 2 - PPSV23) 10/28/2015  . MAMMOGRAM  07/20/2020  . COLONOSCOPY  10/02/2021  . TETANUS/TDAP  05/23/2023  . INFLUENZA VACCINE  Completed  . DEXA SCAN  Completed  . Hepatitis C Screening  Completed    Cancer Screenings: Lung: Low Dose CT Chest recommended if Age 3-80 years, 30 pack-year currently smoking OR have quit w/in 15years. Patient does not qualify. Breast:  Up to date on Mammogram? Yes   Up to date of Bone Density/Dexa? Yes Colorectal: up to date  Additional Screenings: : Hepatitis C Screening: 11/2016     Plan:    Patient wants to complete goal of losing 77 pounds. She has already lost 75.   I have personally reviewed and noted the following in the patient's chart:   . Medical and social history . Use of alcohol, tobacco or illicit drugs  . Current medications and supplements . Functional ability and status . Nutritional status . Physical activity . Advanced directives . List of other physicians . Hospitalizations, surgeries, and ER visits in previous 12 months . Vitals . Screenings to include cognitive, depression, and falls . Referrals and appointments  In addition, I have reviewed and discussed with patient certain preventive protocols, quality metrics, and best practice recommendations. A written personalized care plan for preventive services as well as general preventive health recommendations were provided to patient.     Barb Merino, LPN  [phone]

## 2019-05-02 NOTE — Patient Instructions (Signed)
Health Maintenance, Female Adopting a healthy lifestyle and getting preventive care are important in promoting health and wellness. Ask your health care provider about:  The right schedule for you to have regular tests and exams.  Things you can do on your own to prevent diseases and keep yourself healthy. What should I know about diet, weight, and exercise? Eat a healthy diet   Eat a diet that includes plenty of vegetables, fruits, low-fat dairy products, and lean protein.  Do not eat a lot of foods that are high in solid fats, added sugars, or sodium. Maintain a healthy weight Body mass index (BMI) is used to identify weight problems. It estimates body fat based on height and weight. Your health care provider can help determine your BMI and help you achieve or maintain a healthy weight. Get regular exercise Get regular exercise. This is one of the most important things you can do for your health. Most adults should:  Exercise for at least 150 minutes each week. The exercise should increase your heart rate and make you sweat (moderate-intensity exercise).  Do strengthening exercises at least twice a week. This is in addition to the moderate-intensity exercise.  Spend less time sitting. Even light physical activity can be beneficial. Watch cholesterol and blood lipids Have your blood tested for lipids and cholesterol at 68 years of age, then have this test every 5 years. Have your cholesterol levels checked more often if:  Your lipid or cholesterol levels are high.  You are older than 68 years of age.  You are at high risk for heart disease. What should I know about cancer screening? Depending on your health history and family history, you may need to have cancer screening at various ages. This may include screening for:  Breast cancer.  Cervical cancer.  Colorectal cancer.  Skin cancer.  Lung cancer. What should I know about heart disease, diabetes, and high blood  pressure? Blood pressure and heart disease  High blood pressure causes heart disease and increases the risk of stroke. This is more likely to develop in people who have high blood pressure readings, are of African descent, or are overweight.  Have your blood pressure checked: ? Every 3-5 years if you are 18-39 years of age. ? Every year if you are 40 years old or older. Diabetes Have regular diabetes screenings. This checks your fasting blood sugar level. Have the screening done:  Once every three years after age 40 if you are at a normal weight and have a low risk for diabetes.  More often and at a younger age if you are overweight or have a high risk for diabetes. What should I know about preventing infection? Hepatitis B If you have a higher risk for hepatitis B, you should be screened for this virus. Talk with your health care provider to find out if you are at risk for hepatitis B infection. Hepatitis C Testing is recommended for:  Everyone born from 1945 through 1965.  Anyone with known risk factors for hepatitis C. Sexually transmitted infections (STIs)  Get screened for STIs, including gonorrhea and chlamydia, if: ? You are sexually active and are younger than 68 years of age. ? You are older than 68 years of age and your health care provider tells you that you are at risk for this type of infection. ? Your sexual activity has changed since you were last screened, and you are at increased risk for chlamydia or gonorrhea. Ask your health care provider if   you are at risk.  Ask your health care provider about whether you are at high risk for HIV. Your health care provider may recommend a prescription medicine to help prevent HIV infection. If you choose to take medicine to prevent HIV, you should first get tested for HIV. You should then be tested every 3 months for as long as you are taking the medicine. Pregnancy  If you are about to stop having your period (premenopausal) and  you may become pregnant, seek counseling before you get pregnant.  Take 400 to 800 micrograms (mcg) of folic acid every day if you become pregnant.  Ask for birth control (contraception) if you want to prevent pregnancy. Osteoporosis and menopause Osteoporosis is a disease in which the bones lose minerals and strength with aging. This can result in bone fractures. If you are 65 years old or older, or if you are at risk for osteoporosis and fractures, ask your health care provider if you should:  Be screened for bone loss.  Take a calcium or vitamin D supplement to lower your risk of fractures.  Be given hormone replacement therapy (HRT) to treat symptoms of menopause. Follow these instructions at home: Lifestyle  Do not use any products that contain nicotine or tobacco, such as cigarettes, e-cigarettes, and chewing tobacco. If you need help quitting, ask your health care provider.  Do not use street drugs.  Do not share needles.  Ask your health care provider for help if you need support or information about quitting drugs. Alcohol use  Do not drink alcohol if: ? Your health care provider tells you not to drink. ? You are pregnant, may be pregnant, or are planning to become pregnant.  If you drink alcohol: ? Limit how much you use to 0-1 drink a day. ? Limit intake if you are breastfeeding.  Be aware of how much alcohol is in your drink. In the [place] one drink equals one 12 oz bottle of beer (355 mL), one 5 oz glass of wine (148 mL), or one 1 oz glass of hard liquor (44 mL). General instructions  Schedule regular health, dental, and eye exams.  Stay current with your vaccines.  Tell your health care provider if: ? You often feel depressed. ? You have ever been abused or do not feel safe at home. Summary  Adopting a healthy lifestyle and getting preventive care are important in promoting health and wellness.  Follow your health care provider's instructions about healthy  diet, exercising, and getting tested or screened for diseases.  Follow your health care provider's instructions on monitoring your cholesterol and blood pressure. This information is not intended to replace advice given to you by your health care provider. Make sure you discuss any questions you have with your health care provider. Document Released: 12/20/2010 Document Revised: 05/30/2018 Document Reviewed: 05/30/2018 Elsevier Patient Education  2020 Elsevier Inc.  

## 2019-05-02 NOTE — Progress Notes (Signed)
Subjective:     Patient ID: Lauren Berry , female    DOB: [birth year] , 68 y.o.   MRN: [id number]   Chief Complaint  Patient presents with  . Annual Exam  . Hypertension    HPI  She is here today for a full physical examination. She would like to have a pap smear today.  Hypertension This is a chronic problem. The current episode started more than 1 year ago. The problem has been gradually improving since onset. The problem is controlled. Pertinent negatives include no blurred vision, palpitations or shortness of breath. Risk factors for coronary artery disease include obesity, post-menopausal state and sedentary lifestyle. Past treatments include angiotensin blockers, diuretics and calcium channel blockers. The current treatment provides moderate improvement.     Past Medical History:  Diagnosis Date  . Anemia   . Blood transfusion   . Cataract   . Diverticulosis   . Hemorrhoids internal and external  . Hypertension      Family History  Problem Relation Age of Onset  . Breast cancer Mother 34  . Diabetes Father      Current Outpatient Medications:  .  Ascorbic Acid (VITAMIN C) 100 MG tablet, Take 100 mg by mouth daily., Disp: , Rfl:  .  B Complex Vitamins (B COMPLEX 1 PO), Take by mouth., Disp: , Rfl:  .  cholecalciferol (VITAMIN D) 1000 UNITS tablet, Take 1,000 Units by mouth daily., Disp: , Rfl:  .  Olmesartan-amLODIPine-HCTZ 40-10-12.5 MG TABS, Take 1 tablet by mouth daily., Disp: 90 tablet, Rfl: 2   Allergies  Allergen Reactions  . Penicillins      The patient states she uses post menopausal status for birth control. Last LMP was No LMP recorded. Patient is postmenopausal.. Negative for Dysmenorrhea Negative for: breast discharge, breast lump(s), breast pain and breast self exam. Associated symptoms include abnormal vaginal bleeding. Pertinent negatives include abnormal bleeding (hematology), anxiety, decreased libido, depression, difficulty falling sleep,  dyspareunia, history of infertility, nocturia, sexual dysfunction, sleep disturbances, urinary incontinence, urinary urgency, vaginal discharge and vaginal itching. Diet regular.The patient states her exercise level is    . The patient's tobacco use is:  Social History   Tobacco Use  Smoking Status Never Smoker  Smokeless Tobacco Never Used  . She has been exposed to passive smoke. The patient's alcohol use is:  Social History   Substance and Sexual Activity  Alcohol Use No    Review of Systems  Constitutional: Negative.   HENT: Negative.   Eyes: Negative.  Negative for blurred vision.  Respiratory: Negative.  Negative for shortness of breath.   Cardiovascular: Negative.  Negative for palpitations.  Endocrine: Negative.   Genitourinary: Negative.   Musculoskeletal: Negative.   Skin: Negative.   Allergic/Immunologic: Negative.   Neurological: Positive for numbness.       She c/o numbness in R middle finger and R second toe. She is not sure what triggers her sx, other than sx usually occur at night. She denies both RUE and RLE weakness.   Hematological: Negative.   Psychiatric/Behavioral: Negative.      Today's Vitals   05/02/19 0851  BP: 118/76  Pulse: 78  Temp: 98.5 F (36.9 C)  TempSrc: Oral  Weight: 203 lb 9.6 oz (92.4 kg)  Height: 5' 1.4" (1.56 m)  PainSc: 0-No pain   Body mass index is 37.97 kg/m.   Objective:  Physical Exam Vitals signs and nursing note reviewed. Exam conducted with a chaperone present.  Constitutional:  Appearance: Normal appearance. She is obese.  HENT:     Head: Normocephalic and atraumatic.     Right Ear: Tympanic membrane, ear canal and external ear normal.     Left Ear: Tympanic membrane, ear canal and external ear normal.     Nose: Nose normal.     Mouth/Throat:     Mouth: Mucous membranes are moist.     Pharynx: Oropharynx is clear.  Eyes:     Extraocular Movements: Extraocular movements intact.     Conjunctiva/sclera:  Conjunctivae normal.     Pupils: Pupils are equal, round, and reactive to light.  Neck:     Musculoskeletal: Normal range of motion and neck supple.  Cardiovascular:     Rate and Rhythm: Normal rate and regular rhythm.     Pulses: Normal pulses.     Heart sounds: Normal heart sounds.  Pulmonary:     Effort: Pulmonary effort is normal.     Breath sounds: Normal breath sounds.  Chest:     Breasts: Tanner Score is 5.        Right: Normal.        Left: Normal.     Comments: Pendulous breasts b/l Abdominal:     General: Bowel sounds are normal.     Palpations: Abdomen is soft.     Hernia: There is no hernia in the left inguinal area or right inguinal area.     Comments: obese  Genitourinary:    General: Normal vulva.     Exam position: Lithotomy position.     Labia:        Right: No rash or tenderness.        Left: No rash or tenderness.      Vagina: Normal.     Uterus: Normal.      Rectum: Guaiac result negative.     Comments: Difficult to visualize cervix Musculoskeletal: Normal range of motion.  Skin:    General: Skin is warm and dry.  Neurological:     General: No focal deficit present.     Mental Status: She is alert and oriented to person, place, and time.  Psychiatric:        Mood and Affect: Mood normal.        Behavior: Behavior normal.         Assessment And Plan:     1. Routine general medical examination at health care facility  A full exam was performed.  Importance of monthly self breast exams was discussed with the patient. PATIENT HAS BEEN ADVISED TO GET 30-45 MINUTES REGULAR EXERCISE NO LESS THAN FOUR TO FIVE DAYS PER WEEK - BOTH WEIGHTBEARING EXERCISES AND AEROBIC ARE RECOMMENDED.  HE/SHE WAS ADVISED TO FOLLOW A HEALTHY DIET WITH AT LEAST SIX FRUITS/VEGGIES PER DAY, DECREASE INTAKE OF RED MEAT, AND TO INCREASE FISH INTAKE TO TWO DAYS PER WEEK.  MEATS/FISH SHOULD NOT BE FRIED, BAKED OR BROILED IS PREFERABLE.  I SUGGEST WEARING SPF 50 SUNSCREEN ON EXPOSED  PARTS AND ESPECIALLY WHEN IN THE DIRECT SUNLIGHT FOR AN EXTENDED PERIOD OF TIME.  PLEASE AVOID FAST FOOD RESTAURANTS AND INCREASE YOUR WATER INTAKE.  - POCT Urinalysis Dipstick (81002) - POCT UA - Microalbumin  2. Cervical smear, as part of routine gynecological examination  Pap smear performed. Stool heme negative.   - Cytology -Pap Smear  3. Essential hypertension, benign  Chronic, well controlled. She will continue with current meds for now. Pt advised that she may develop dizziness as she continues to lose  weight. At that time, it will be time to decrease her medication. She denies having any sx lightheadedness and dizziness at this time. EKG performed, no new changes. She is encouraged to avoid adding salt to her foods. She will rto in six months for re-evaluation. However, is encouraged to return sooner if needed .  - EKG 12-Lead - CMP14+EGFR - CBC - Lipid panel - POCT Urinalysis Dipstick (81002) - POCT UA - Microalbumin  4. Paresthesia  I will check vitamin B12 levels. TSH drawn earlier this year was normal.   - Vitamin B12  5. Other abnormal glucose  HER A1C HAS BEEN ELEVATED IN THE PAST. I WILL CHECK AN A1C, BMET TODAY. SHE WAS ENCOURAGED TO AVOID SUGARY BEVERAGES AND PROCESSED FOODS INCLUDNG BREADS, RICE AND PASTA.  - Hemoglobin A1c  6. Class 2 severe obesity due to excess calories with serious comorbidity and body mass index (BMI) of 37.0 to 37.9 in adult [facility])  She was congratulated on her 60+ pound weight loss since February 2020.  She is encouraged to keep up the great work. She is encouraged to strive to lose another 20 pounds to further decrease her cardiac risk.  7. Encounter for screening  - Novel Coronavirus, NAA ([facility])  8. Need for vaccination  - Flu vaccine HIGH DOSE PF (Fluzone High dose)   Maximino Greenland, MD    THE PATIENT IS ENCOURAGED TO PRACTICE SOCIAL DISTANCING DUE TO THE COVID-19 PANDEMIC.

## 2019-05-02 NOTE — Patient Instructions (Signed)
* Lauren Berry , Thank you for taking time to come for your Medicare Wellness Visit. I appreciate your ongoing commitment to your health goals. Please review the following plan we discussed and let me know if I can assist you in the future.   Screening recommendations/referrals: Colonoscopy: 09/2011 Mammogram: 06/2018 Bone Density: 12/2016 Recommended yearly ophthalmology/optometry visit for glaucoma screening and checkup Recommended yearly dental visit for hygiene and checkup  Vaccinations: Influenza vaccine: today Pneumococcal vaccine: due Tdap vaccine: 05/2013 Shingles vaccine: 08/2018    Advanced directives: Please bring a copy of your POA (Power of [place]) and/or Living Will to your next appointment.    Conditions/risks identified: obesity  Next appointment: 10/24/2019 at 10:00   Preventive Care 65 Years and Older, Female Preventive care refers to lifestyle choices and visits with your health care provider that can promote health and wellness. What does preventive care include?  A yearly physical exam. This is also called an annual well check.  Dental exams once or twice a year.  Routine eye exams. Ask your health care provider how often you should have your eyes checked.  Personal lifestyle choices, including:  Daily care of your teeth and gums.  Regular physical activity.  Eating a healthy diet.  Avoiding tobacco and drug use.  Limiting alcohol use.  Practicing safe sex.  Taking low-dose aspirin every day.  Taking vitamin and mineral supplements as recommended by your health care provider. What happens during an annual well check? The services and screenings done by your health care provider during your annual well check will depend on your age, overall health, lifestyle risk factors, and family history of disease. Counseling  Your health care provider may ask you questions about your:  Alcohol use.  Tobacco use.  Drug use.  Emotional well-being.   Home and relationship well-being.  Sexual activity.  Eating habits.  History of falls.  Memory and ability to understand (cognition).  Work and work Statistician.  Reproductive health. Screening  You may have the following tests or measurements:  Height, weight, and BMI.  Blood pressure.  Lipid and cholesterol levels. These may be checked every 5 years, or more frequently if you are over 33 years old.  Skin check.  Lung cancer screening. You may have this screening every year starting at age 17 if you have a 30-pack-year history of smoking and currently smoke or have quit within the past 15 years.  Fecal occult blood test (FOBT) of the stool. You may have this test every year starting at age 62.  Flexible sigmoidoscopy or colonoscopy. You may have a sigmoidoscopy every 5 years or a colonoscopy every 10 years starting at age 58.  Hepatitis C blood test.  Hepatitis B blood test.  Sexually transmitted disease (STD) testing.  Diabetes screening. This is done by checking your blood sugar (glucose) after you have not eaten for a while (fasting). You may have this done every 1-3 years.  Bone density scan. This is done to screen for osteoporosis. You may have this done starting at age 32.  Mammogram. This may be done every 1-2 years. Talk to your health care provider about how often you should have regular mammograms. Talk with your health care provider about your test results, treatment options, and if necessary, the need for more tests. Vaccines  Your health care provider may recommend certain vaccines, such as:  Influenza vaccine. This is recommended every year.  Tetanus, diphtheria, and acellular pertussis (Tdap, Td) vaccine. You may need a Td  booster every 10 years.  Zoster vaccine. You may need this after age 62.  Pneumococcal 13-valent conjugate (PCV13) vaccine. One dose is recommended after age 74.  Pneumococcal polysaccharide (PPSV23) vaccine. One dose is  recommended after age 77. Talk to your health care provider about which screenings and vaccines you need and how often you need them. This information is not intended to replace advice given to you by your health care provider. Make sure you discuss any questions you have with your health care provider. Document Released: 07/03/2015 Document Revised: 02/24/2016 Document Reviewed: 04/07/2015 Elsevier Interactive Patient Education  2017 [place] Prevention in the Home Falls can cause injuries. They can happen to people of all ages. There are many things you can do to make your home safe and to help prevent falls. What can I do on the outside of my home?  Regularly fix the edges of walkways and driveways and fix any cracks.  Remove anything that might make you trip as you walk through a door, such as a raised step or threshold.  Trim any bushes or trees on the path to your home.  Use bright outdoor lighting.  Clear any walking paths of anything that might make someone trip, such as rocks or tools.  Regularly check to see if handrails are loose or broken. Make sure that both sides of any steps have handrails.  Any raised decks and porches should have guardrails on the edges.  Have any leaves, snow, or ice cleared regularly.  Use sand or salt on walking paths during winter.  Clean up any spills in your garage right away. This includes oil or grease spills. What can I do in the bathroom?  Use night lights.  Install grab bars by the toilet and in the tub and shower. Do not use towel bars as grab bars.  Use non-skid mats or decals in the tub or shower.  If you need to sit down in the shower, use a plastic, non-slip stool.  Keep the floor dry. Clean up any water that spills on the floor as soon as it happens.  Remove soap buildup in the tub or shower regularly.  Attach bath mats securely with double-sided non-slip rug tape.  Do not have throw rugs and other things on  the floor that can make you trip. What can I do in the bedroom?  Use night lights.  Make sure that you have a light by your bed that is easy to reach.  Do not use any sheets or blankets that are too big for your bed. They should not hang down onto the floor.  Have a firm chair that has side arms. You can use this for support while you get dressed.  Do not have throw rugs and other things on the floor that can make you trip. What can I do in the kitchen?  Clean up any spills right away.  Avoid walking on wet floors.  Keep items that you use a lot in easy-to-reach places.  If you need to reach something above you, use a strong step stool that has a grab bar.  Keep electrical cords out of the way.  Do not use floor polish or wax that makes floors slippery. If you must use wax, use non-skid floor wax.  Do not have throw rugs and other things on the floor that can make you trip. What can I do with my stairs?  Do not leave any items on the stairs.  Make sure that there are handrails on both sides of the stairs and use them. Fix handrails that are broken or loose. Make sure that handrails are as long as the stairways.  Check any carpeting to make sure that it is firmly attached to the stairs. Fix any carpet that is loose or worn.  Avoid having throw rugs at the top or bottom of the stairs. If you do have throw rugs, attach them to the floor with carpet tape.  Make sure that you have a light switch at the top of the stairs and the bottom of the stairs. If you do not have them, ask someone to add them for you. What else can I do to help prevent falls?  Wear shoes that:  Do not have high heels.  Have rubber bottoms.  Are comfortable and fit you well.  Are closed at the toe. Do not wear sandals.  If you use a stepladder:  Make sure that it is fully opened. Do not climb a closed stepladder.  Make sure that both sides of the stepladder are locked into place.  Ask someone to  hold it for you, if possible.  Clearly mark and make sure that you can see:  Any grab bars or handrails.  First and last steps.  Where the edge of each step is.  Use tools that help you move around (mobility aids) if they are needed. These include:  Canes.  Walkers.  Scooters.  Crutches.  Turn on the lights when you go into a dark area. Replace any light bulbs as soon as they burn out.  Set up your furniture so you have a clear path. Avoid moving your furniture around.  If any of your floors are uneven, fix them.  If there are any pets around you, be aware of where they are.  Review your medicines with your doctor. Some medicines can make you feel dizzy. This can increase your chance of falling. Ask your doctor what other things that you can do to help prevent falls. This information is not intended to replace advice given to you by your health care provider. Make sure you discuss any questions you have with your health care provider. Document Released: 04/02/2009 Document Revised: 11/12/2015 Document Reviewed: 07/11/2014 Elsevier Interactive Patient Education  2017 [employer].

## 2019-05-03 LAB — CYTOLOGY - PAP
Adequacy: ABSENT
Comment: NEGATIVE
Diagnosis: NEGATIVE
High risk HPV: NEGATIVE

## 2019-05-03 LAB — CBC
Hematocrit: 42.6 % (ref 34.0–46.6)
Hemoglobin: 13.8 g/dL (ref 11.1–15.9)
MCH: 30.1 pg (ref 26.6–33.0)
MCHC: 32.4 g/dL (ref 31.5–35.7)
MCV: 93 fL (ref 79–97)
Platelets: 224 10*3/uL (ref 150–450)
RBC: 4.59 x10E6/uL (ref 3.77–5.28)
RDW: 13.1 % (ref 11.7–15.4)
WBC: 5.3 10*3/uL (ref 3.4–10.8)

## 2019-05-03 LAB — LIPID PANEL
Chol/HDL Ratio: 2.6 ratio (ref 0.0–4.4)
Cholesterol, Total: 172 mg/dL (ref 100–199)
HDL: 67 mg/dL (ref >39)
LDL Chol Calc (NIH): 96 mg/dL (ref 0–99)
Triglycerides: 41 mg/dL (ref 0–149)
VLDL Cholesterol Cal: 9 mg/dL (ref 5–40)

## 2019-05-03 LAB — CMP14+EGFR
ALT: 20 IU/L (ref 0–32)
AST: 20 IU/L (ref 0–40)
Albumin/Globulin Ratio: 1.2 (ref 1.2–2.2)
Albumin: 3.9 g/dL (ref 3.8–4.8)
Alkaline Phosphatase: 100 IU/L (ref 39–117)
BUN/Creatinine Ratio: 16 (ref 12–28)
BUN: 13 mg/dL (ref 8–27)
Bilirubin Total: 0.3 mg/dL (ref 0.0–1.2)
CO2: 25 mmol/L (ref 20–29)
Calcium: 10 mg/dL (ref 8.7–10.3)
Chloride: 102 mmol/L (ref 96–106)
Creatinine, Ser: 0.8 mg/dL (ref 0.57–1.00)
GFR calc Af Amer: 88 mL/min/{1.73_m2} (ref >59)
GFR calc non Af Amer: 76 mL/min/{1.73_m2} (ref >59)
Globulin, Total: 3.2 g/dL (ref 1.5–4.5)
Glucose: 100 mg/dL — ABNORMAL HIGH (ref 65–99)
Potassium: 4.4 mmol/L (ref 3.5–5.2)
Sodium: 142 mmol/L (ref 134–144)
Total Protein: 7.1 g/dL (ref 6.0–8.5)

## 2019-05-03 LAB — VITAMIN B12: Vitamin B-12: 492 pg/mL (ref 232–1245)

## 2019-05-03 LAB — HEMOGLOBIN A1C
Est. average glucose Bld gHb Est-mCnc: 117 mg/dL
Hgb A1c MFr Bld: 5.7 % — ABNORMAL HIGH (ref 4.8–5.6)

## 2019-05-03 LAB — POC HEMOCCULT BLD/STL (OFFICE/1-CARD/DIAGNOSTIC): Fecal Occult Blood, POC: NEGATIVE

## 2019-05-04 LAB — NOVEL CORONAVIRUS, NAA: SARS-CoV-2, NAA: NOT DETECTED

## 2019-05-06 ENCOUNTER — Telehealth: Payer: Self-pay

## 2019-05-06 NOTE — Telephone Encounter (Signed)
Left the patient a message to call back for lab results. 

## 2019-05-06 NOTE — Telephone Encounter (Signed)
-----   Message from Glendale Chard, MD sent at 05/05/2019  4:00 PM EST ----- Your COVID test is negative.  Your blood count, liver and kidney fxn are stable. Your chol is great. It has dropped 11 points in the past year. Keep up the great work! Your hba1c is 5.7, this is in prediabetes range. Normal is 5.6, you are almost there! Keep up the great work! Be sure to have an exercise plan for the winter. Your vitamin b12 level Is normal.  Your pap smear is negative. Happy holidays and stay safe!

## 2019-07-17 ENCOUNTER — Other Ambulatory Visit: Payer: Self-pay | Admitting: Internal Medicine

## 2019-07-17 DIAGNOSIS — Z1231 Encounter for screening mammogram for malignant neoplasm of breast: Secondary | ICD-10-CM

## 2019-08-23 ENCOUNTER — Other Ambulatory Visit: Payer: Self-pay

## 2019-08-23 ENCOUNTER — Ambulatory Visit
Admission: RE | Admit: 2019-08-23 | Discharge: 2019-08-23 | Disposition: A | Discharge status: Home / Self Care | Payer: Medicare Other | Source: Ambulatory Visit | Attending: Internal Medicine | Admitting: Internal Medicine

## 2019-08-23 DIAGNOSIS — Z1231 Encounter for screening mammogram for malignant neoplasm of breast: Secondary | ICD-10-CM

## 2019-10-24 ENCOUNTER — Ambulatory Visit (INDEPENDENT_AMBULATORY_CARE_PROVIDER_SITE_OTHER): Payer: Medicare Other | Admitting: Internal Medicine

## 2019-10-24 ENCOUNTER — Encounter: Payer: Self-pay | Admitting: Internal Medicine

## 2019-10-24 ENCOUNTER — Other Ambulatory Visit: Payer: Self-pay

## 2019-10-24 VITALS — BP 156/94 | HR 65 | Temp 98.1°F | Ht 61.4 in | Wt 201.6 lb

## 2019-10-24 DIAGNOSIS — I10 Essential (primary) hypertension: Secondary | ICD-10-CM | POA: Diagnosis not present

## 2019-10-24 DIAGNOSIS — Z6837 Body mass index (BMI) 37.0-37.9, adult: Secondary | ICD-10-CM | POA: Diagnosis not present

## 2019-10-24 DIAGNOSIS — R7309 Other abnormal glucose: Secondary | ICD-10-CM | POA: Diagnosis not present

## 2019-10-24 MED ORDER — OLMESARTAN-AMLODIPINE-HCTZ 40-10-12.5 MG PO TABS: 1.0000 | ORAL_TABLET | Freq: Every day | ORAL | 2 refills | Status: DC

## 2019-10-24 NOTE — Patient Instructions (Signed)

## 2019-10-25 LAB — BMP8+EGFR
BUN/Creatinine Ratio: 21 (ref 12–28)
BUN: 17 mg/dL (ref 8–27)
CO2: 25 mmol/L (ref 20–29)
Calcium: 9.5 mg/dL (ref 8.7–10.3)
Chloride: 103 mmol/L (ref 96–106)
Creatinine, Ser: 0.8 mg/dL (ref 0.57–1.00)
GFR calc Af Amer: 88 mL/min/{1.73_m2} (ref >59)
GFR calc non Af Amer: 76 mL/min/{1.73_m2} (ref >59)
Glucose: 87 mg/dL (ref 65–99)
Potassium: 4.4 mmol/L (ref 3.5–5.2)
Sodium: 140 mmol/L (ref 134–144)

## 2019-10-25 LAB — HEMOGLOBIN A1C
Est. average glucose Bld gHb Est-mCnc: 108 mg/dL
Hgb A1c MFr Bld: 5.4 % (ref 4.8–5.6)

## 2019-10-26 NOTE — Progress Notes (Signed)
This visit occurred during the SARS-CoV-2 public health emergency.  Safety protocols were in place, including screening questions prior to the visit, additional usage of staff PPE, and extensive cleaning of exam room while observing appropriate contact time as indicated for disinfecting solutions.  Subjective:     Patient ID: Lauren Berry , female    DOB: [birth year] , 69 y.o.   MRN: [id number]   Chief Complaint  Patient presents with  . Hypertension    HPI  She is here today for BP check. She reports compliance with meds. Not sure why her BP is up today. Admits she has yet to take meds today. Denies headaches, chest pain and palpitations.   Hypertension This is a chronic problem. The current episode started more than 1 year ago. The problem has been gradually improving since onset. The problem is controlled. Pertinent negatives include no blurred vision, chest pain, palpitations or shortness of breath. Risk factors for coronary artery disease include dyslipidemia, obesity and post-menopausal state.     Past Medical History:  Diagnosis Date  . Anemia   . Blood transfusion   . Cataract   . Diverticulosis   . Hemorrhoids internal and external  . Hypertension      Family History  Problem Relation Age of Onset  . Breast cancer Mother 9  . Diabetes Father      Current Outpatient Medications:  .  Ascorbic Acid (VITAMIN C) 100 MG tablet, Take 100 mg by mouth daily., Disp: , Rfl:  .  B Complex Vitamins (B COMPLEX 1 PO), Take by mouth., Disp: , Rfl:  .  cholecalciferol (VITAMIN D) 1000 UNITS tablet, Take 1,000 Units by mouth daily., Disp: , Rfl:  .  Olmesartan-amLODIPine-HCTZ 40-10-12.5 MG TABS, Take 1 tablet by mouth daily., Disp: 90 tablet, Rfl: 2   Allergies  Allergen Reactions  . Penicillins      Review of Systems  Constitutional: Negative.   Eyes: Negative for blurred vision.  Respiratory: Negative.  Negative for shortness of breath.   Cardiovascular: Negative.   Negative for chest pain and palpitations.  Gastrointestinal: Negative.   Neurological: Negative.   Psychiatric/Behavioral: Negative.      Today's Vitals   10/24/19 0949  BP: (!) 156/94  Pulse: 65  Temp: 98.1 F (36.7 C)  TempSrc: Oral  Weight: 201 lb 9.6 oz (91.4 kg)  Height: 5' 1.4" (1.56 m)  PainSc: 0-No pain   Body mass index is 37.6 kg/m.   Wt Readings from Last 3 Encounters:  10/24/19 201 lb 9.6 oz (91.4 kg)  05/02/19 203 lb (92.1 kg)  05/02/19 203 lb 9.6 oz (92.4 kg)     Objective:  Physical Exam Vitals and nursing note reviewed.  Constitutional:      Appearance: Normal appearance. She is obese.  HENT:     Head: Normocephalic and atraumatic.  Cardiovascular:     Rate and Rhythm: Normal rate and regular rhythm.     Heart sounds: Normal heart sounds.  Pulmonary:     Effort: Pulmonary effort is normal.     Breath sounds: Normal breath sounds.  Skin:    General: Skin is warm.  Neurological:     General: No focal deficit present.     Mental Status: She is alert.  Psychiatric:        Mood and Affect: Mood normal.        Behavior: Behavior normal.         Assessment And Plan:     1. Essential  hypertension, benign  Chronic, uncontrolled. I will have her return to office in two weeks for a nurse visit. If no improvement, will add additional meds at that time. I will check renal function today.   - BMP8+EGFR  2. Other abnormal glucose  HER A1C HAS BEEN ELEVATED IN THE PAST. I WILL CHECK AN A1C, BMET TODAY. SHE WAS ENCOURAGED TO AVOID SUGARY BEVERAGES AND PROCESSED FOODS INCLUDNG BREADS, RICE AND PASTA.  - Hemoglobin A1c  3. Class 2 severe obesity due to excess calories with serious comorbidity and body mass index (BMI) of 37.0 to 37.9 in adult [facility])  She was congratulated for maintaining her weight loss. She has lost 76 pounds since Nov 2019.  She is ncouraged to strive for BMI less than 30 to decrease cardiac risk.   Maximino Greenland, MD    THE  PATIENT IS ENCOURAGED TO PRACTICE SOCIAL DISTANCING DUE TO THE COVID-19 PANDEMIC.

## 2020-01-05 IMAGING — MG DIGITAL SCREENING BILATERAL MAMMOGRAM WITH TOMO AND CAD
8 of 16 series · 8 of 40 positions shown · non-contrast
Comparison: Previous exam(s).

CLINICAL DATA: Screening.

EXAM:
DIGITAL SCREENING BILATERAL MAMMOGRAM WITH TOMO AND CAD

[R MLO synth-2D (1 of 2)]
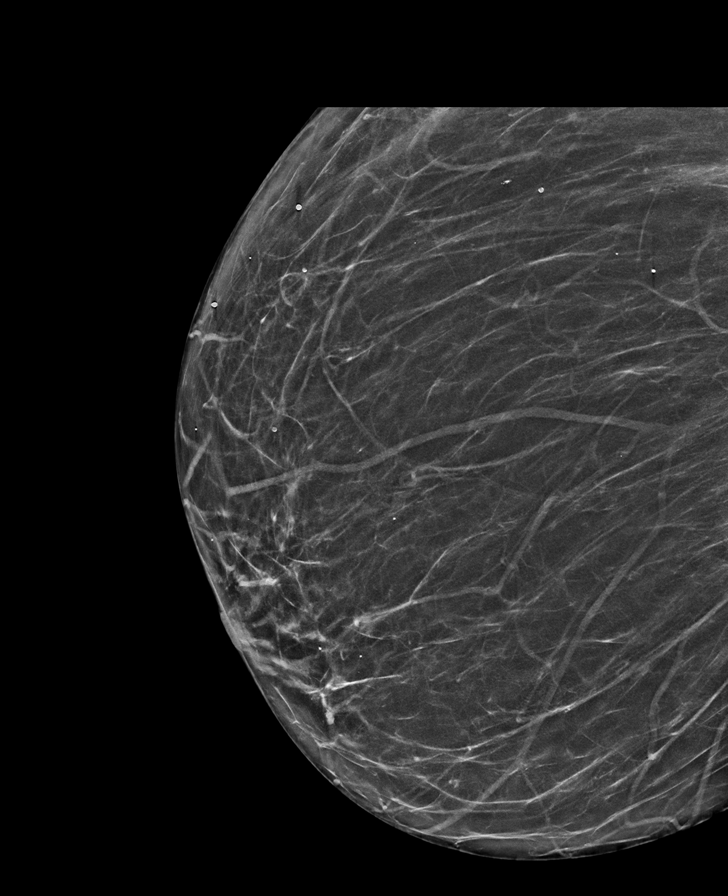

[R CC synth-2D (1 of 2)]
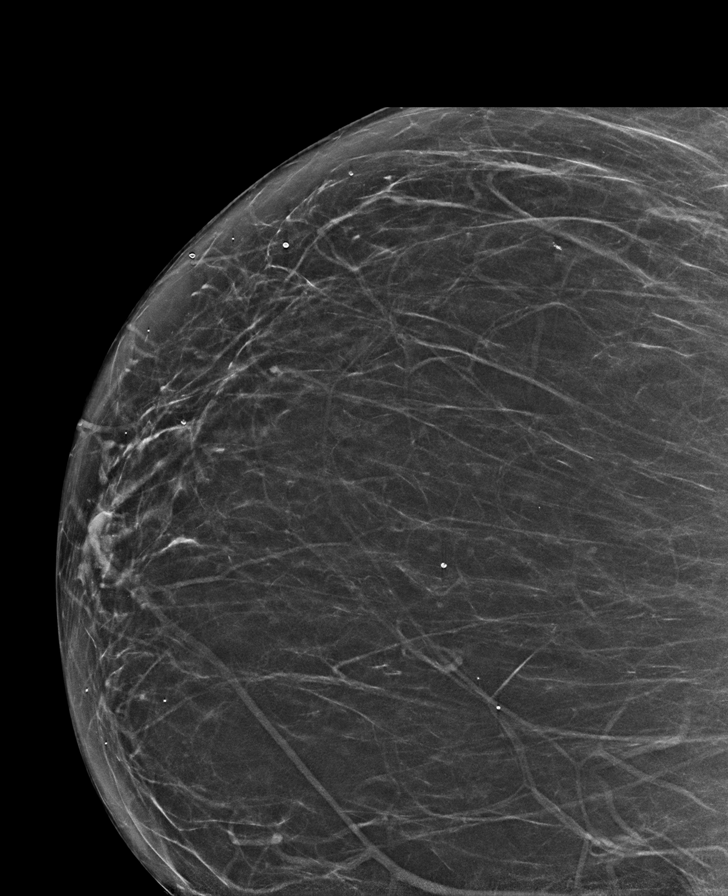

[L CC synth-2D]
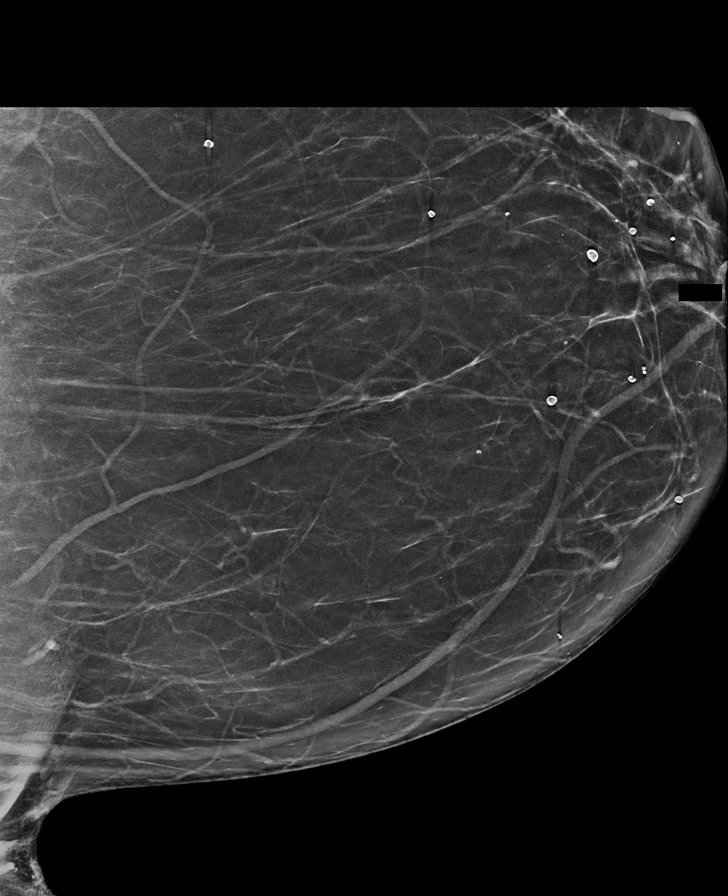

[R CC synth-2D (2 of 2)]
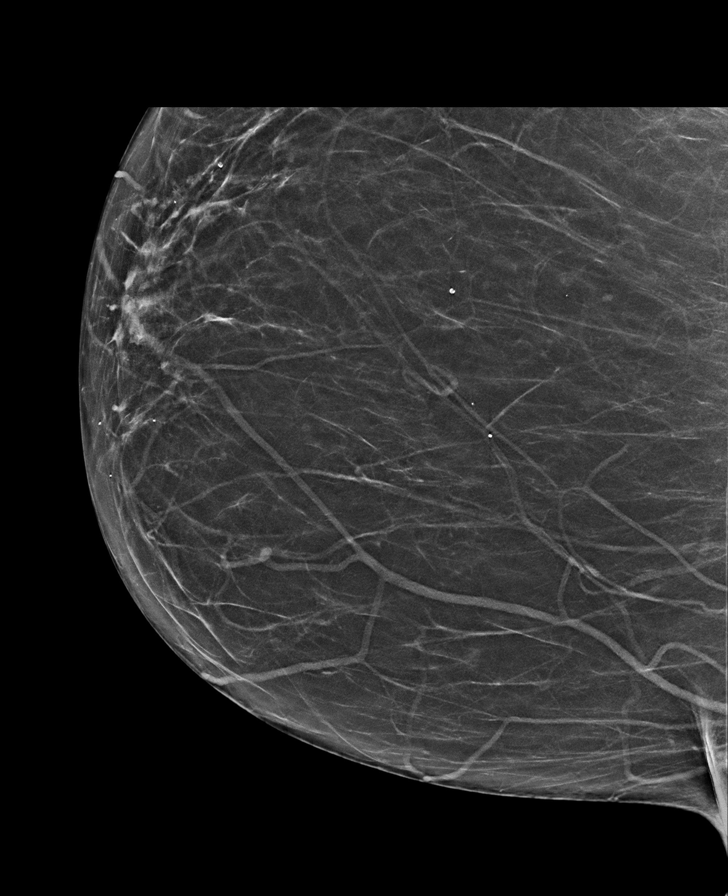

[R MLO synth-2D (2 of 2)]
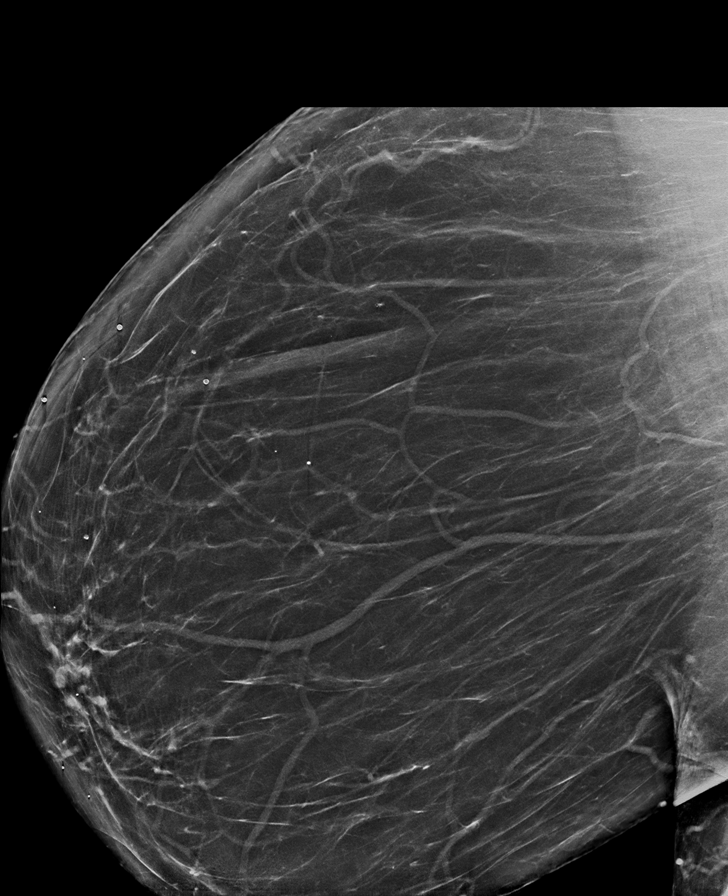

[L MLO synth-2D (1 of 2)]
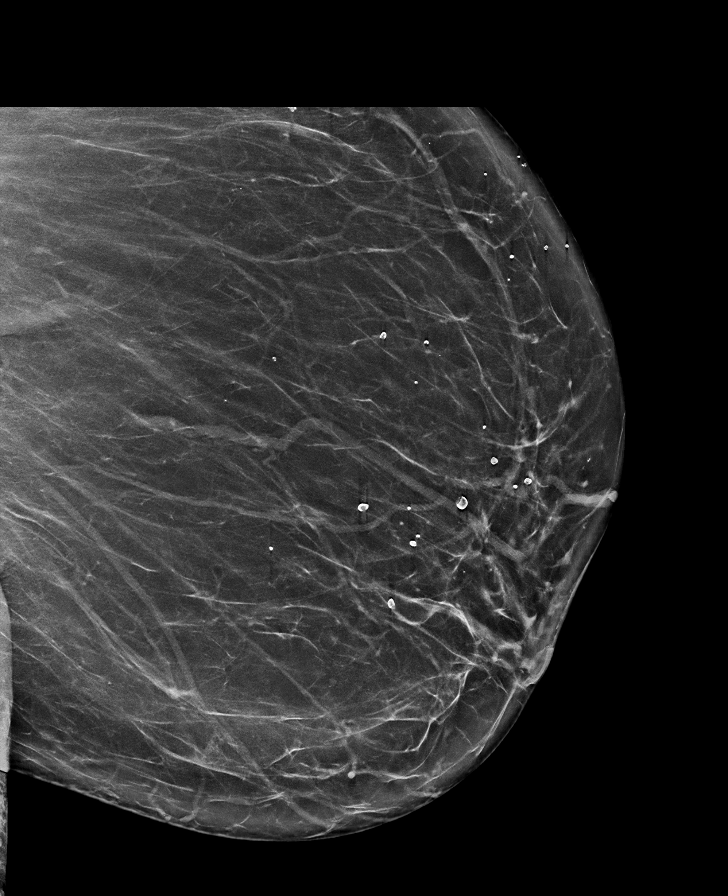

[L MLO synth-2D (2 of 2)]
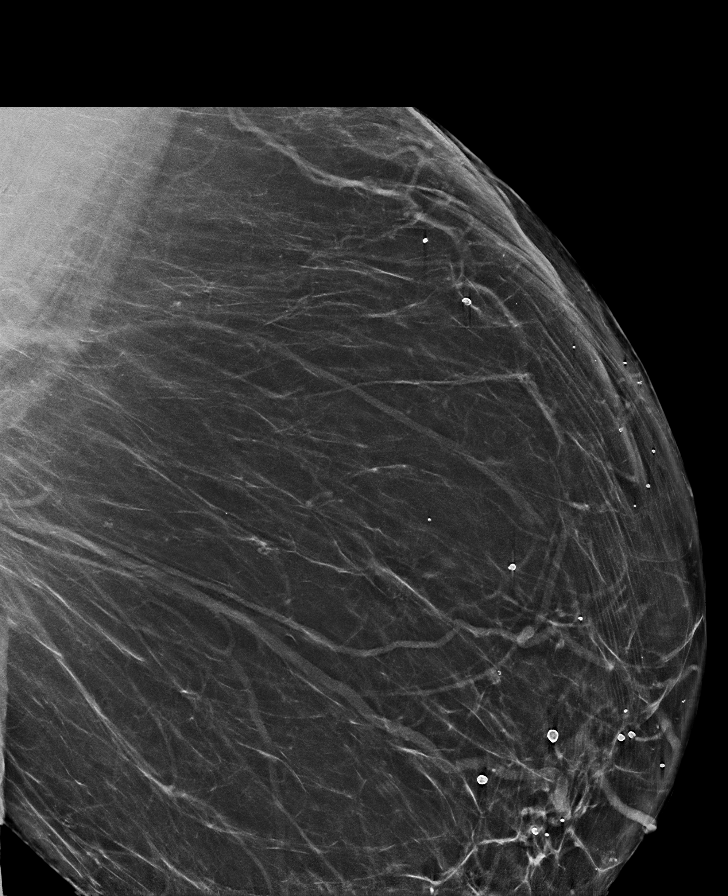

[L CC tomo · tomo slice 31/60.0]
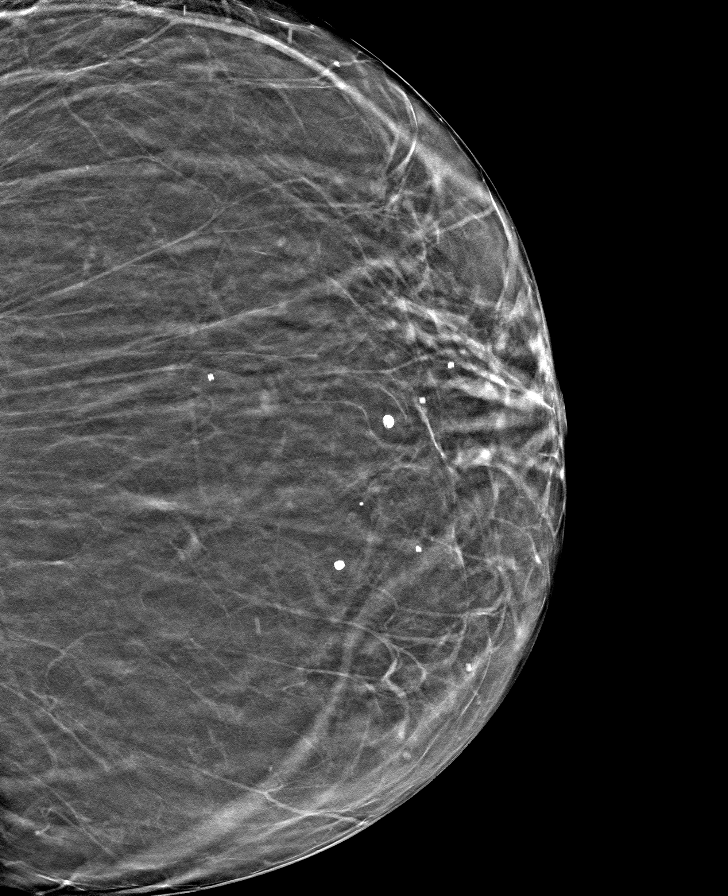

[8 of 40 positions shown; findings below may reference images not displayed]

ACR Breast Density Category b: There are scattered areas of
fibroglandular density.
FINDINGS: There are no findings suspicious for malignancy. Images were
processed with CAD.
IMPRESSION: No mammographic evidence of malignancy. A result letter of this
screening mammogram will be mailed directly to the patient.

RECOMMENDATION:
Screening mammogram in one year. (Code:[id number])

BI-RADS CATEGORY  1: Negative.

## 2020-02-27 DIAGNOSIS — H40023 Open angle with borderline findings, high risk, bilateral: Secondary | ICD-10-CM | POA: Diagnosis not present

## 2020-03-02 ENCOUNTER — Telehealth: Payer: Self-pay

## 2020-03-02 ENCOUNTER — Other Ambulatory Visit: Payer: Self-pay

## 2020-03-02 ENCOUNTER — Encounter: Payer: Self-pay | Admitting: Nurse Practitioner

## 2020-03-02 ENCOUNTER — Telehealth (INDEPENDENT_AMBULATORY_CARE_PROVIDER_SITE_OTHER): Payer: Medicare Other | Admitting: Nurse Practitioner

## 2020-03-02 DIAGNOSIS — J029 Acute pharyngitis, unspecified: Secondary | ICD-10-CM | POA: Diagnosis not present

## 2020-03-02 DIAGNOSIS — R059 Cough, unspecified: Secondary | ICD-10-CM

## 2020-03-02 DIAGNOSIS — R05 Cough: Secondary | ICD-10-CM

## 2020-03-02 MED ORDER — AZITHROMYCIN 250 MG PO TABS: ORAL_TABLET | ORAL | 0 refills | Status: DC

## 2020-03-02 MED ORDER — AZITHROMYCIN 250 MG PO TABS: ORAL_TABLET | ORAL | 0 refills | Status: AC

## 2020-03-02 NOTE — Progress Notes (Signed)
Virtual Visit via MyChart   This visit type was conducted due to national recommendations for restrictions regarding the COVID-19 Pandemic (e.g. social distancing) in an effort to limit this patient's exposure and mitigate transmission in our community.  Due to her co-morbid illnesses, this patient is at least at moderate risk for complications without adequate follow up.  This format is felt to be most appropriate for this patient at this time.  All issues noted in this document were discussed and addressed.  A limited physical exam was performed with this format.    This visit type was conducted due to national recommendations for restrictions regarding the COVID-19 Pandemic (e.g. social distancing) in an effort to limit this patient's exposure and mitigate transmission in our community.  Patients identity confirmed using two different identifiers.  This format is felt to be most appropriate for this patient at this time.  All issues noted in this document were discussed and addressed.  No physical exam was performed (except for noted visual exam findings with Video Visits).    Date:  03/02/2020   ID:  Lauren Berry, DOB 1951-06-12, MRN [id number]  Patient Location:  Home - spoke with Lauren Berry  Provider location:   Office    Chief Complaint:  Sore throat  History of Present Illness:    Lauren Berry is a 69 y.o. female who presents via video conferencing for a telehealth visit today.    The patient does have symptoms concerning for COVID-19 infection (fever, chills, cough, or new shortness of breath).   She has been vaccinated for covid.  She has not been tested for covid. She reports the symptoms subsided then this morning she had nasal phlegm.     Sore Throat  This is a new problem. The current episode started in the past 7 days (last Saturday). The problem has been gradually worsening. There has been no fever. Associated symptoms include congestion and coughing (phlegm  - dark green). Pertinent negatives include no abdominal pain, headaches or shortness of breath. She has tried acetaminophen (hot tea) for the symptoms.     Past Medical History:  Diagnosis Date  . Anemia   . Blood transfusion   . Cataract   . Diverticulosis   . Hemorrhoids internal and external  . Hypertension    Past Surgical History:  Procedure Laterality Date  . CESAREAN SECTION  1976 and 1982  . COLONOSCOPY  10/03/2011   Procedure: COLONOSCOPY;  Surgeon: Charna Elizabeth, MD;  Location: [facility];  Service: Endoscopy;  Laterality: N/A;  . EYE SURGERY  2001   bilateral cataract     Current Meds  Medication Sig  . Ascorbic Acid (VITAMIN C) 100 MG tablet Take 100 mg by mouth daily.  . B Complex Vitamins (B COMPLEX 1 PO) Take by mouth.  . cholecalciferol (VITAMIN D) 1000 UNITS tablet Take 1,000 Units by mouth daily.  . Olmesartan-amLODIPine-HCTZ 40-10-12.5 MG TABS Take 1 tablet by mouth daily.     Allergies:   Penicillins   Social History   Tobacco Use  . Smoking status: Never Smoker  . Smokeless tobacco: Never Used  Vaping Use  . Vaping Use: Never used  Substance Use Topics  . Alcohol use: No  . Drug use: No     Family Hx: The patient's family history includes Breast cancer (age of onset: 38) in her mother; Diabetes in her father.  ROS:   Please see the history of present illness.    Review of Systems  Constitutional: Positive for chills. Negative for weight loss.  HENT: Positive for congestion.   Respiratory: Positive for cough (phlegm - dark green). Negative for shortness of breath.   Cardiovascular: Negative.   Gastrointestinal: Negative for abdominal pain.  Neurological: Negative for dizziness and headaches.  Psychiatric/Behavioral: Negative.     All other systems reviewed and are negative.   Labs/Other Tests and Data Reviewed:    Recent Labs: 05/02/2019: ALT 20; Hemoglobin 13.8; Platelets 224 10/24/2019: BUN 17; Creatinine, Ser 0.80; Potassium 4.4;  Sodium 140   Recent Lipid Panel Lab Results  Component Value Date/Time   CHOL 172 05/02/2019 10:45 AM   TRIG 41 05/02/2019 10:45 AM   HDL 67 05/02/2019 10:45 AM   CHOLHDL 2.6 05/02/2019 10:45 AM   LDLCALC 96 05/02/2019 10:45 AM    Wt Readings from Last 3 Encounters:  10/24/19 201 lb 9.6 oz (91.4 kg)  05/02/19 203 lb (92.1 kg)  05/02/19 203 lb 9.6 oz (92.4 kg)     Exam:    Vital Signs:  There were no vitals taken for this visit.    Physical Exam Constitutional:      General: She is not in acute distress.    Appearance: She is well-developed.  Cardiovascular:     Rate and Rhythm: Normal rate and regular rhythm.     Heart sounds: Normal heart sounds. No murmur heard.   Skin:    Capillary Refill: Capillary refill takes less than 2 seconds.  Neurological:     General: No focal deficit present.     Mental Status: She is alert and oriented to person, place, and time.     ASSESSMENT & PLAN:    1. Sore throat 1. Encouraged to do warm salt water gargles 2. Will treat with antibiotic due to 10 day history of symptoms. - azithromycin (ZITHROMAX) 250 MG tablet; Take 2 tablets (500 mg) on  Day 1,  followed by 1 tablet (250 mg) once daily on Days 2 through 5.  Dispense: 6 each; Refill: 0  2. Cough  Productive cough noted during virtual visit  If worsens she is to call to office  I have also encouraged her to go for a covid test however she is 10 days into the symptoms - azithromycin (ZITHROMAX) 250 MG tablet; Take 2 tablets (500 mg) on  Day 1,  followed by 1 tablet (250 mg) once daily on Days 2 through 5.  Dispense: 6 each; Refill: 0   COVID-19 Education: The signs and symptoms of COVID-19 were discussed with the patient and how to seek care for testing (follow up with PCP or arrange E-visit).  The importance of social distancing was discussed today.  Patient Risk:   After full review of this patients clinical status, I feel that they are at least moderate risk at this  time.  Time:   Today, I have spent 11 minutes/ seconds with the patient with telehealth technology discussing above diagnoses.     Medication Adjustments/Labs and Tests Ordered: Current medicines are reviewed at length with the patient today.  Concerns regarding medicines are outlined above.   Tests Ordered: No orders of the defined types were placed in this encounter.   Medication Changes: Meds ordered this encounter  Medications  . DISCONTD: azithromycin (ZITHROMAX) 250 MG tablet    Sig: Take 2 tablets (500 mg) on  Day 1,  followed by 1 tablet (250 mg) once daily on Days 2 through 5.    Dispense:  6 each  Refill:  0  . azithromycin (ZITHROMAX) 250 MG tablet    Sig: Take 2 tablets (500 mg) on  Day 1,  followed by 1 tablet (250 mg) once daily on Days 2 through 5.    Dispense:  6 each    Refill:  0    Disposition:  Follow up prn  Signed, Arnette Felts, FNP

## 2020-03-02 NOTE — Telephone Encounter (Signed)
The pt called and requested a video visit because she said she has a bad cold.

## 2020-05-07 ENCOUNTER — Ambulatory Visit (INDEPENDENT_AMBULATORY_CARE_PROVIDER_SITE_OTHER): Payer: Medicare Other

## 2020-05-07 ENCOUNTER — Encounter: Payer: Self-pay | Admitting: Internal Medicine

## 2020-05-07 ENCOUNTER — Ambulatory Visit (INDEPENDENT_AMBULATORY_CARE_PROVIDER_SITE_OTHER): Payer: Medicare Other | Admitting: Internal Medicine

## 2020-05-07 ENCOUNTER — Other Ambulatory Visit: Payer: Self-pay

## 2020-05-07 VITALS — BP 140/86 | HR 71 | Temp 98.2°F | Ht 61.4 in | Wt 197.0 lb

## 2020-05-07 VITALS — BP 140/86 | HR 71 | Temp 98.2°F | Ht 61.4 in | Wt 197.2 lb

## 2020-05-07 DIAGNOSIS — Z23 Encounter for immunization: Secondary | ICD-10-CM | POA: Diagnosis not present

## 2020-05-07 DIAGNOSIS — I10 Essential (primary) hypertension: Secondary | ICD-10-CM | POA: Diagnosis not present

## 2020-05-07 DIAGNOSIS — Z6836 Body mass index (BMI) 36.0-36.9, adult: Secondary | ICD-10-CM

## 2020-05-07 DIAGNOSIS — M25511 Pain in right shoulder: Secondary | ICD-10-CM

## 2020-05-07 DIAGNOSIS — W182XXA Fall in (into) shower or empty bathtub, initial encounter: Secondary | ICD-10-CM

## 2020-05-07 DIAGNOSIS — E559 Vitamin D deficiency, unspecified: Secondary | ICD-10-CM | POA: Diagnosis not present

## 2020-05-07 DIAGNOSIS — Z0000 Encounter for general adult medical examination without abnormal findings: Secondary | ICD-10-CM

## 2020-05-07 LAB — POCT UA - MICROALBUMIN
Albumin/Creatinine Ratio, Urine, POC: 30
Creatinine, POC: 200 mg/dL
Microalbumin Ur, POC: 10 mg/L

## 2020-05-07 LAB — POCT URINALYSIS DIPSTICK
Bilirubin, UA: NEGATIVE
Blood, UA: NEGATIVE
Glucose, UA: NEGATIVE
Ketones, UA: NEGATIVE
Leukocytes, UA: NEGATIVE
Nitrite, UA: NEGATIVE
Protein, UA: NEGATIVE
Spec Grav, UA: >=1.03 — AB (ref 1.010–1.025)
Urobilinogen, UA: 0.2 E.U./dL
pH, UA: 7 (ref 5.0–8.0)

## 2020-05-07 MED ORDER — OLMESARTAN-AMLODIPINE-HCTZ 40-10-12.5 MG PO TABS: 1.0000 | ORAL_TABLET | Freq: Every day | ORAL | 2 refills | Status: DC

## 2020-05-07 MED ORDER — PREVNAR 13 IM SUSP: 0.5000 mL | INTRAMUSCULAR | 0 refills | Status: AC

## 2020-05-07 NOTE — Patient Instructions (Signed)
Health Maintenance After Age 69 After age 69, you are at a higher risk for certain long-term diseases and infections as well as injuries from falls. Falls are a major cause of broken bones and head injuries in people who are older than age 69. Getting regular preventive care can help to keep you healthy and well. Preventive care includes getting regular testing and making lifestyle changes as recommended by your health care provider. Talk with your health care provider about:  Which screenings and tests you should have. A screening is a test that checks for a disease when you have no symptoms.  A diet and exercise plan that is right for you. What should I know about screenings and tests to prevent falls? Screening and testing are the best ways to find a health problem early. Early diagnosis and treatment give you the best chance of managing medical conditions that are common after age 69. Certain conditions and lifestyle choices may make you more likely to have a fall. Your health care provider may recommend:  Regular vision checks. Poor vision and conditions such as cataracts can make you more likely to have a fall. If you wear glasses, make sure to get your prescription updated if your vision changes.  Medicine review. Work with your health care provider to regularly review all of the medicines you are taking, including over-the-counter medicines. Ask your health care provider about any side effects that may make you more likely to have a fall. Tell your health care provider if any medicines that you take make you feel dizzy or sleepy.  Osteoporosis screening. Osteoporosis is a condition that causes the bones to get weaker. This can make the bones weak and cause them to break more easily.  Blood pressure screening. Blood pressure changes and medicines to control blood pressure can make you feel dizzy.  Strength and balance checks. Your health care provider may recommend certain tests to check your  strength and balance while standing, walking, or changing positions.  Foot health exam. Foot pain and numbness, as well as not wearing proper footwear, can make you more likely to have a fall.  Depression screening. You may be more likely to have a fall if you have a fear of falling, feel emotionally low, or feel unable to do activities that you used to do.  Alcohol use screening. Using too much alcohol can affect your balance and may make you more likely to have a fall. What actions can I take to lower my risk of falls? General instructions  Talk with your health care provider about your risks for falling. Tell your health care provider if: ? You fall. Be sure to tell your health care provider about all falls, even ones that seem minor. ? You feel dizzy, sleepy, or off-balance.  Take over-the-counter and prescription medicines only as told by your health care provider. These include any supplements.  Eat a healthy diet and maintain a healthy weight. A healthy diet includes low-fat dairy products, low-fat (lean) meats, and fiber from whole grains, beans, and lots of fruits and vegetables. Home safety  Remove any tripping hazards, such as rugs, cords, and clutter.  Install safety equipment such as grab bars in bathrooms and safety rails on stairs.  Keep rooms and walkways well-lit. Activity   Follow a regular exercise program to stay fit. This will help you maintain your balance. Ask your health care provider what types of exercise are appropriate for you.  If you need a cane or   walker, use it as recommended by your health care provider.  Wear supportive shoes that have nonskid soles. Lifestyle  Do not drink alcohol if your health care provider tells you not to drink.  If you drink alcohol, limit how much you have: ? 0-1 drink a day for women. ? 0-2 drinks a day for men.  Be aware of how much alcohol is in your drink. In the [place] one drink equals one typical bottle of beer (12  oz), one-half glass of wine (5 oz), or one shot of hard liquor (1 oz).  Do not use any products that contain nicotine or tobacco, such as cigarettes and e-cigarettes. If you need help quitting, ask your health care provider. Summary  Having a healthy lifestyle and getting preventive care can help to protect your health and wellness after age 69.  Screening and testing are the best way to find a health problem early and help you avoid having a fall. Early diagnosis and treatment give you the best chance for managing medical conditions that are more common for people who are older than age 69.  Falls are a major cause of broken bones and head injuries in people who are older than age 69. Take precautions to prevent a fall at home.  Work with your health care provider to learn what changes you can make to improve your health and wellness and to prevent falls. This information is not intended to replace advice given to you by your health care provider. Make sure you discuss any questions you have with your health care provider. Document Revised: 09/27/2018 Document Reviewed: 04/19/2017 Elsevier Patient Education  2020 Elsevier Inc[employer]  

## 2020-05-07 NOTE — Patient Instructions (Signed)
Lauren Berry , Thank you for taking time to come for your Medicare Wellness Visit. I appreciate your ongoing commitment to your health goals. Please review the following plan we discussed and let me know if I can assist you in the future.   Screening recommendations/referrals: Colonoscopy: completed 10/03/2011, due 10/02/2021 Mammogram: completed 08/23/2019, due 08/22/2020 Bone Density: completed 01/10/2017 Recommended yearly ophthalmology/optometry visit for glaucoma screening and checkup Recommended yearly dental visit for hygiene and checkup  Vaccinations: Influenza vaccine: today Pneumococcal vaccine: completed 04/30/2012 Tdap vaccine: completed 05/22/2013, due 05/23/2023 Shingles vaccine: needs second dose   Covid-19: 08/26/2019, 08/01/2019  Advanced directives: Please bring a copy of your POA (Power of Attorney) and/or Living Will to your next appointment.   Conditions/risks identified: none  Next appointment: 11/04/2020 at 10:30 Follow up in one year for your annual wellness visit    Preventive Care 65 Years and Older, Female Preventive care refers to lifestyle choices and visits with your health care provider that can promote health and wellness. What does preventive care include?  A yearly physical exam. This is also called an annual well check.  Dental exams once or twice a year.  Routine eye exams. Ask your health care provider how often you should have your eyes checked.  Personal lifestyle choices, including:  Daily care of your teeth and gums.  Regular physical activity.  Eating a healthy diet.  Avoiding tobacco and drug use.  Limiting alcohol use.  Practicing safe sex.  Taking low-dose aspirin every day.  Taking vitamin and mineral supplements as recommended by your health care provider. What happens during an annual well check? The services and screenings done by your health care provider during your annual well check will depend on your age, overall health,  lifestyle risk factors, and family history of disease. Counseling  Your health care provider may ask you questions about your:  Alcohol use.  Tobacco use.  Drug use.  Emotional well-being.  Home and relationship well-being.  Sexual activity.  Eating habits.  History of falls.  Memory and ability to understand (cognition).  Work and work Astronomer.  Reproductive health. Screening  You may have the following tests or measurements:  Height, weight, and BMI.  Blood pressure.  Lipid and cholesterol levels. These may be checked every 5 years, or more frequently if you are over 53 years old.  Skin check.  Lung cancer screening. You may have this screening every year starting at age 61 if you have a 30-pack-year history of smoking and currently smoke or have quit within the past 15 years.  Fecal occult blood test (FOBT) of the stool. You may have this test every year starting at age 42.  Flexible sigmoidoscopy or colonoscopy. You may have a sigmoidoscopy every 5 years or a colonoscopy every 10 years starting at age 32.  Hepatitis C blood test.  Hepatitis B blood test.  Sexually transmitted disease (STD) testing.  Diabetes screening. This is done by checking your blood sugar (glucose) after you have not eaten for a while (fasting). You may have this done every 1-3 years.  Bone density scan. This is done to screen for osteoporosis. You may have this done starting at age 24.  Mammogram. This may be done every 1-2 years. Talk to your health care provider about how often you should have regular mammograms. Talk with your health care provider about your test results, treatment options, and if necessary, the need for more tests. Vaccines  Your health care provider may recommend certain  vaccines, such as:  Influenza vaccine. This is recommended every year.  Tetanus, diphtheria, and acellular pertussis (Tdap, Td) vaccine. You may need a Td booster every 10 years.  Zoster  vaccine. You may need this after age 45.  Pneumococcal 13-valent conjugate (PCV13) vaccine. One dose is recommended after age 41.  Pneumococcal polysaccharide (PPSV23) vaccine. One dose is recommended after age 23. Talk to your health care provider about which screenings and vaccines you need and how often you need them. This information is not intended to replace advice given to you by your health care provider. Make sure you discuss any questions you have with your health care provider. Document Released: 07/03/2015 Document Revised: 02/24/2016 Document Reviewed: 04/07/2015 Elsevier Interactive Patient Education  2017 [place] Prevention in the Home Falls can cause injuries. They can happen to people of all ages. There are many things you can do to make your home safe and to help prevent falls. What can I do on the outside of my home?  Regularly fix the edges of walkways and driveways and fix any cracks.  Remove anything that might make you trip as you walk through a door, such as a raised step or threshold.  Trim any bushes or trees on the path to your home.  Use bright outdoor lighting.  Clear any walking paths of anything that might make someone trip, such as rocks or tools.  Regularly check to see if handrails are loose or broken. Make sure that both sides of any steps have handrails.  Any raised decks and porches should have guardrails on the edges.  Have any leaves, snow, or ice cleared regularly.  Use sand or salt on walking paths during winter.  Clean up any spills in your garage right away. This includes oil or grease spills. What can I do in the bathroom?  Use night lights.  Install grab bars by the toilet and in the tub and shower. Do not use towel bars as grab bars.  Use non-skid mats or decals in the tub or shower.  If you need to sit down in the shower, use a plastic, non-slip stool.  Keep the floor dry. Clean up any water that spills on the  floor as soon as it happens.  Remove soap buildup in the tub or shower regularly.  Attach bath mats securely with double-sided non-slip rug tape.  Do not have throw rugs and other things on the floor that can make you trip. What can I do in the bedroom?  Use night lights.  Make sure that you have a light by your bed that is easy to reach.  Do not use any sheets or blankets that are too big for your bed. They should not hang down onto the floor.  Have a firm chair that has side arms. You can use this for support while you get dressed.  Do not have throw rugs and other things on the floor that can make you trip. What can I do in the kitchen?  Clean up any spills right away.  Avoid walking on wet floors.  Keep items that you use a lot in easy-to-reach places.  If you need to reach something above you, use a strong step stool that has a grab bar.  Keep electrical cords out of the way.  Do not use floor polish or wax that makes floors slippery. If you must use wax, use non-skid floor wax.  Do not have throw rugs and other things  on the floor that can make you trip. What can I do with my stairs?  Do not leave any items on the stairs.  Make sure that there are handrails on both sides of the stairs and use them. Fix handrails that are broken or loose. Make sure that handrails are as long as the stairways.  Check any carpeting to make sure that it is firmly attached to the stairs. Fix any carpet that is loose or worn.  Avoid having throw rugs at the top or bottom of the stairs. If you do have throw rugs, attach them to the floor with carpet tape.  Make sure that you have a light switch at the top of the stairs and the bottom of the stairs. If you do not have them, ask someone to add them for you. What else can I do to help prevent falls?  Wear shoes that:  Do not have high heels.  Have rubber bottoms.  Are comfortable and fit you well.  Are closed at the toe. Do not wear  sandals.  If you use a stepladder:  Make sure that it is fully opened. Do not climb a closed stepladder.  Make sure that both sides of the stepladder are locked into place.  Ask someone to hold it for you, if possible.  Clearly mark and make sure that you can see:  Any grab bars or handrails.  First and last steps.  Where the edge of each step is.  Use tools that help you move around (mobility aids) if they are needed. These include:  Canes.  Walkers.  Scooters.  Crutches.  Turn on the lights when you go into a dark area. Replace any light bulbs as soon as they burn out.  Set up your furniture so you have a clear path. Avoid moving your furniture around.  If any of your floors are uneven, fix them.  If there are any pets around you, be aware of where they are.  Review your medicines with your doctor. Some medicines can make you feel dizzy. This can increase your chance of falling. Ask your doctor what other things that you can do to help prevent falls. This information is not intended to replace advice given to you by your health care provider. Make sure you discuss any questions you have with your health care provider. Document Released: 04/02/2009 Document Revised: 11/12/2015 Document Reviewed: 07/11/2014 Elsevier Interactive Patient Education  2017 [employer].

## 2020-05-07 NOTE — Progress Notes (Signed)
I,Katawbba Wiggins,acting as a Education administrator for Maximino Greenland, MD.,have documented all relevant documentation on the behalf of Maximino Greenland, MD,as directed by  Maximino Greenland, MD while in the presence of Maximino Greenland, MD.  This visit occurred during the SARS-CoV-2 public health emergency.  Safety protocols were in place, including screening questions prior to the visit, additional usage of staff PPE, and extensive cleaning of exam room while observing appropriate contact time as indicated for disinfecting solutions.  Subjective:     Patient ID: Lauren Berry , female    DOB: 06-05-1951 , 69 y.o.   MRN: [id number]   Chief Complaint  Patient presents with  . Annual Exam  . Hypertension    HPI  She is here today for a full physical examination. Her last pap was in 2020. She has no specific concerns at this time.    Hypertension This is a chronic problem. The current episode started more than 1 year ago. The problem has been gradually improving since onset. The problem is controlled. Pertinent negatives include no blurred vision, palpitations or shortness of breath. Risk factors for coronary artery disease include obesity, post-menopausal state and sedentary lifestyle. Past treatments include angiotensin blockers, diuretics and calcium channel blockers. The current treatment provides moderate improvement.     Past Medical History:  Diagnosis Date  . Anemia   . Blood transfusion   . Cataract   . Diverticulosis   . Hemorrhoids internal and external  . Hypertension      Family History  Problem Relation Age of Onset  . Breast cancer Mother 76  . Diabetes Father      Current Outpatient Medications:  .  Ascorbic Acid (VITAMIN C) 100 MG tablet, Take 100 mg by mouth daily., Disp: , Rfl:  .  B Complex Vitamins (B COMPLEX 1 PO), Take by mouth., Disp: , Rfl:  .  cholecalciferol (VITAMIN D) 1000 UNITS tablet, Take 1,000 Units by mouth daily., Disp: , Rfl:  .   Olmesartan-amLODIPine-HCTZ 40-10-12.5 MG TABS, Take 1 tablet by mouth daily., Disp: 90 tablet, Rfl: 2 .  pneumococcal 13-valent conjugate vaccine (PREVNAR 13) SUSP injection, Inject 0.5 mLs into the muscle tomorrow at 10 am for 1 dose., Disp: 0.5 mL, Rfl: 0   Allergies  Allergen Reactions  . Penicillins       The patient states she uses post menopausal status for birth control. Last LMP was No LMP recorded. Patient is postmenopausal.. Negative for Dysmenorrhea. Negative for: breast discharge, breast lump(s), breast pain and breast self exam. Associated symptoms include abnormal vaginal bleeding. Pertinent negatives include abnormal bleeding (hematology), anxiety, decreased libido, depression, difficulty falling sleep, dyspareunia, history of infertility, nocturia, sexual dysfunction, sleep disturbances, urinary incontinence, urinary urgency, vaginal discharge and vaginal itching. Diet regular.The patient states her exercise level is  moderate.   . The patient's tobacco use is:  Social History   Tobacco Use  Smoking Status Never Smoker  Smokeless Tobacco Never Used  . She has been exposed to passive smoke. The patient's alcohol use is:  Social History   Substance and Sexual Activity  Alcohol Use No    Review of Systems  Constitutional: Negative.   HENT: Negative.   Eyes: Negative.  Negative for blurred vision.  Respiratory: Negative.  Negative for shortness of breath.   Cardiovascular: Negative.  Negative for palpitations.  Gastrointestinal: Negative.   Endocrine: Negative.   Genitourinary: Negative.   Musculoskeletal: Positive for arthralgias.       She c/o right  shoulder pain.  She reports she fell a month ago. She was taking a shower, saw a spider and fell towards the wall - hit her right shoulder. She did not fall to the ground. She reports she has been exercising her arm which has helped her. She still has some discomfort with certain movements.   Skin: Negative.    Allergic/Immunologic: Negative.   Neurological: Negative.   Hematological: Negative.   Psychiatric/Behavioral: Negative.      Today's Vitals   05/07/20 0927  BP: 140/86  Pulse: 71  Temp: 98.2 F (36.8 C)  TempSrc: Oral  Weight: 197 lb 3.2 oz (89.4 kg)  Height: 5' 1.4" (1.56 m)  PainSc: 9   PainLoc: Shoulder   Body mass index is 36.78 kg/m.  Wt Readings from Last 3 Encounters:  05/07/20 197 lb 3.2 oz (89.4 kg)  05/07/20 197 lb (89.4 kg)  10/24/19 201 lb 9.6 oz (91.4 kg)   Objective:  Physical Exam Constitutional:      General: She is not in acute distress.    Appearance: Normal appearance. She is well-developed. She is obese.  HENT:     Head: Normocephalic and atraumatic.     Right Ear: Hearing, tympanic membrane, ear canal and external ear normal. There is no impacted cerumen.     Left Ear: Hearing, tympanic membrane, ear canal and external ear normal. There is no impacted cerumen.     Nose:     Comments: Deferred, masked    Mouth/Throat:     Comments: Deferred, masked Eyes:     General: Lids are normal.     Extraocular Movements: Extraocular movements intact.     Conjunctiva/sclera: Conjunctivae normal.     Pupils: Pupils are equal, round, and reactive to light.     Funduscopic exam:    Right eye: No papilledema.        Left eye: No papilledema.  Neck:     Thyroid: No thyroid mass.     Vascular: No carotid bruit.  Cardiovascular:     Rate and Rhythm: Normal rate and regular rhythm.     Pulses: Normal pulses.     Heart sounds: Normal heart sounds. No murmur heard.   Pulmonary:     Effort: Pulmonary effort is normal.     Breath sounds: Normal breath sounds.  Chest:     Breasts: Tanner Score is 5.        Right: Normal.        Left: Normal.     Comments: pendulous Abdominal:     General: Bowel sounds are normal. There is no distension.     Palpations: Abdomen is soft.     Tenderness: There is no abdominal tenderness.  Genitourinary:    Comments:  Deferred Musculoskeletal:        General: No swelling. Normal range of motion.     Cervical back: Full passive range of motion without pain, normal range of motion and neck supple.     Right lower leg: No edema.     Left lower leg: No edema.  Skin:    General: Skin is warm and dry.     Capillary Refill: Capillary refill takes less than 2 seconds.  Neurological:     General: No focal deficit present.     Mental Status: She is alert and oriented to person, place, and time.     Cranial Nerves: No cranial nerve deficit.     Sensory: No sensory deficit.  Psychiatric:  Mood and Affect: Mood normal.        Behavior: Behavior normal.        Thought Content: Thought content normal.        Judgment: Judgment normal.         Assessment And Plan:     1. Routine general medical examination at health care facility Comments: A full exam was performed. Importance of monthly self breast exam was discussed with the patient. PATIENT IS ADVISED TO GET 30-45 MINUTES REGULAR EXERCISE NO LESS THAN FOUR TO FIVE DAYS PER WEEK - BOTH WEIGHTBEARING EXERCISES AND AEROBIC ARE RECOMMENDED.  PATIENT IS ADVISED TO FOLLOW A HEALTHY DIET WITH AT LEAST SIX FRUITS/VEGGIES PER DAY, DECREASE INTAKE OF RED MEAT, AND TO INCREASE FISH INTAKE TO TWO DAYS PER WEEK.  MEATS/FISH SHOULD NOT BE FRIED, BAKED OR BROILED IS PREFERABLE.  I SUGGEST WEARING SPF 50 SUNSCREEN ON EXPOSED PARTS AND ESPECIALLY WHEN IN THE DIRECT SUNLIGHT FOR AN EXTENDED PERIOD OF TIME.  PLEASE AVOID FAST FOOD RESTAURANTS AND INCREASE YOUR WATER INTAKE.   2. Essential hypertension, benign Comments: Chronic, fair control. She will continue with current meds. EKG performed, SB w/o acute changes. Encouraged to follow low-sodium diet. She will f/u in 6 months. - EKG 12-Lead - CBC - CMP14+EGFR - Lipid panel - POCT Urinalysis Dipstick (81002) - POCT UA - Microalbumin  3. Acute pain of right shoulder Comments: Her sx have improved greatly.  Advised to  apply Voltaren gel prn if her sx persist. She declines Ortho eval at this time. She will let me know if her sx persist/worsen.   4. Fall in shower Comments: This occurred around 10/18. She is advised to consider putting non-slip mats in her shower.   5. Vitamin D deficiency disease Comments: I will check a vitamin D level and supplement as needed.  - Vitamin D (25 hydroxy)  6. Class 2 severe obesity due to excess calories with serious comorbidity and body mass index (BMI) of 36.0 to 36.9 in adult [facility]) Comments: She was congratulated on her continued weight loss.  Encouraged to aim for at least 150 minutes of exercise per week.  She is encouraged to strive for BMI less than 30 to decrease cardiac risk. Advised to aim for at least 150 minutes of exercise per week.    Patient was given opportunity to ask questions. Patient verbalized understanding of the plan and was able to repeat key elements of the plan. All questions were answered to their satisfaction.   Maximino Greenland, MD   I, Maximino Greenland, MD, have reviewed all documentation for this visit. The documentation on 05/07/20 for the exam, diagnosis, procedures, and orders are all accurate and complete.  THE PATIENT IS ENCOURAGED TO PRACTICE SOCIAL DISTANCING DUE TO THE COVID-19 PANDEMIC.

## 2020-05-07 NOTE — Progress Notes (Signed)
This visit occurred during the SARS-CoV-2 public health emergency.  Safety protocols were in place, including screening questions prior to the visit, additional usage of staff PPE, and extensive cleaning of exam room while observing appropriate contact time as indicated for disinfecting solutions.  Subjective:   Lauren Berry is a 69 y.o. female who presents for Medicare Annual (Subsequent) preventive examination.  Review of Systems     Cardiac Risk Factors include: advanced age (>33men, >40 women);hypertension;obesity (BMI >30kg/m2)     Objective:    Today's Vitals   05/07/20 0937 05/07/20 0948  BP: 140/86   Pulse: 71   Temp: 98.2 F (36.8 C)   TempSrc: Oral   Weight: 197 lb (89.4 kg)   Height: 5' 1.4" (1.56 m)   PainSc:  9    Body mass index is 36.74 kg/m.  Advanced Directives 05/07/2020 05/02/2019 04/26/2018 10/03/2011  Does Patient Have a Medical Advance Directive? Yes Yes Yes Patient does not have advance directive  Type of Advance Directive Healthcare Power of [employer] Power of [employer] Power of [place];Living will -  Does patient want to make changes to medical advance directive? - - No - Patient declined -  Copy of Healthcare Power of Attorney in Chart? No - copy requested No - copy requested No - copy requested -    Current Medications (verified) Outpatient Encounter Medications as of 05/07/2020  Medication Sig  . Ascorbic Acid (VITAMIN C) 100 MG tablet Take 100 mg by mouth daily.  . B Complex Vitamins (B COMPLEX 1 PO) Take by mouth.  . cholecalciferol (VITAMIN D) 1000 UNITS tablet Take 1,000 Units by mouth daily.  . pneumococcal 13-valent conjugate vaccine (PREVNAR 13) SUSP injection Inject 0.5 mLs into the muscle tomorrow at 10 am for 1 dose.  . [DISCONTINUED] Olmesartan-amLODIPine-HCTZ 40-10-12.5 MG TABS Take 1 tablet by mouth daily.   No facility-administered encounter medications on file as of 05/07/2020.    Allergies  (verified) Penicillins   History: Past Medical History:  Diagnosis Date  . Anemia   . Blood transfusion   . Cataract   . Diverticulosis   . Hemorrhoids internal and external  . Hypertension    Past Surgical History:  Procedure Laterality Date  . CESAREAN SECTION  1976 and 1982  . COLONOSCOPY  10/03/2011   Procedure: COLONOSCOPY;  Surgeon: Charna Elizabeth, MD;  Location: [facility];  Service: Endoscopy;  Laterality: N/A;  . EYE SURGERY  2001   bilateral cataract   Family History  Problem Relation Age of Onset  . Breast cancer Mother 97  . Diabetes Father    Social History   Socioeconomic History  . Marital status: Married    Spouse name: Not on file  . Number of children: Not on file  . Years of education: Not on file  . Highest education level: Not on file  Occupational History  . Occupation: retired  Tobacco Use  . Smoking status: Never Smoker  . Smokeless tobacco: Never Used  Vaping Use  . Vaping Use: Never used  Substance and Sexual Activity  . Alcohol use: No  . Drug use: No  . Sexual activity: Not Currently  Other Topics Concern  . Not on file  Social History Narrative  . Not on file   Social Determinants of Health   Financial Resource Strain: Low Risk   . Difficulty of Paying Living Expenses: Not hard at all  Food Insecurity: No Food Insecurity  . Worried About Programme researcher, broadcasting/film/video in the  Last Year: Never true  . Ran Out of Food in the Last Year: Never true  Transportation Needs: No Transportation Needs  . Lack of Transportation (Medical): No  . Lack of Transportation (Non-Medical): No  Physical Activity: Sufficiently Active  . Days of Exercise per Week: 5 days  . Minutes of Exercise per Session: 90 min  Stress: No Stress Concern Present  . Feeling of Stress : Not at all  Social Connections:   . Frequency of Communication with Friends and Family: Not on file  . Frequency of Social Gatherings with Friends and Family: Not on file  . Attends  Religious Services: Not on file  . Active Member of Clubs or Organizations: Not on file  . Attends Banker Meetings: Not on file  . Marital Status: Not on file    Tobacco Counseling Counseling given: Not Answered   Clinical Intake:  Pre-visit preparation completed: Yes  Pain : No/denies pain Pain Score: 9      Nutritional Status: BMI > 30  Obese Nutritional Risks: None  How often do you need to have someone help you when you read instructions, pamphlets, or other written materials from your doctor or pharmacy?: 1 - Never What is the last grade level you completed in school?: college  Diabetic? no  Interpreter Needed?: No  Information entered by :: NAllen LPN   Activities of Daily Living In your present state of health, do you have any difficulty performing the following activities: 05/07/2020 05/07/2020  Hearing? N N  Vision? N N  Difficulty concentrating or making decisions? N N  Walking or climbing stairs? N N  Dressing or bathing? N N  Doing errands, shopping? N N  Preparing Food and eating ? N -  Using the Toilet? N -  In the past six months, have you accidently leaked urine? N -  Do you have problems with loss of bowel control? N -  Managing your Medications? N -  Managing your Finances? N -  Housekeeping or managing your Housekeeping? N -  Some recent data might be hidden    Patient Care Team: Dorothyann Peng, MD as PCP - General (Internal Medicine) Shea Evans, [facility])  Indicate any recent Medical Services you may have received from other than [facility] providers in the past year (date may be approximate).     Assessment:   This is a routine wellness examination for Lauren Berry.  Hearing/Vision screen  Hearing Screening   125Hz  250Hz  500Hz  1000Hz  2000Hz  3000Hz  4000Hz  6000Hz  8000Hz   Right ear:           Left ear:           Vision Screening Comments: Regular eye exams,   Dietary issues and exercise activities discussed: Current Exercise  Habits: Home exercise routine, Type of exercise: walking;calisthenics, Time (Minutes): > 60, Frequency (Times/Week): 5, Weekly Exercise (Minutes/Week): 0  Goals    .  Patient Stated      05/02/2019, complete losing 77 pounds already down 75 pounds    .  Patient Stated      05/07/2020, wants to get toned    .  Weight (lb) < 200 lb (90.7 kg) (pt-stated)      Wants to lose 77 pounds      Depression Screen PHQ 2/9 Scores 05/07/2020 05/07/2020 05/02/2019 12/05/2018 11/27/2018 07/30/2018 04/26/2018  PHQ - 2 Score 0 0 0 0 0 0 0  PHQ- 9 Score - - 3 - - - 0    Fall  Risk Fall Risk  05/07/2020 05/07/2020 05/02/2019 05/02/2019 12/05/2018  Falls in the past year? 1 1 0 0 0  Number falls in past yr: 0 0 0 - -  Injury with Fall? 1 1 - - -  Comment - brusied her right shoulder, she fell in the tub because she saw a spider - - -  Risk for fall due to : Medication side effect - Medication side effect - -  Follow up Falls evaluation completed;Education provided;Falls prevention discussed - Falls evaluation completed;Education provided;Falls prevention discussed - -    Any stairs in or around the home? Yes  If so, are there any without handrails? No  Home free of loose throw rugs in walkways, pet beds, electrical cords, etc? Yes  Adequate lighting in your home to reduce risk of falls? Yes   ASSISTIVE DEVICES UTILIZED TO PREVENT FALLS:  Life alert? No  Use of a cane, walker or w/c? No  Grab bars in the bathroom? No  Shower chair or bench in shower? Yes  Elevated toilet seat or a handicapped toilet? Yes   TIMED UP AND GO:  Was the test performed? No . .   Gait slow and steady without use of assistive device  Cognitive Function:     6CIT Screen 05/07/2020 05/02/2019 04/26/2018  What Year? 0 points 0 points 0 points  What month? 0 points 0 points 0 points  What time? 0 points 0 points 0 points  Count back from 20 0 points 0 points 0 points  Months in reverse 0 points 0 points 0 points   Repeat phrase 0 points 2 points 0 points  Total Score 0 2 0    Immunizations Immunization History  Administered Date(s) Administered  . Influenza, High Dose Seasonal PF 03/23/2018, 05/02/2019  . PFIZER SARS-COV-2 Vaccination 08/01/2019, 08/26/2019  . Pneumococcal Polysaccharide-23 04/30/2012  . Zoster Recombinat (Shingrix) 03/23/2018, 08/26/2018    TDAP status: Up to date Flu Vaccine status: Completed at today's visit Pneumococcal vaccine status: sent to pharmacy Covid-19 vaccine status: Completed vaccines  Qualifies for Shingles Vaccine? Yes   Zostavax completed No   Shingrix Completed?: Yes  Screening Tests Health Maintenance  Topic Date Due  . PNA vac Low Risk Adult (1 of 2 - PCV13) 10/28/2015  . INFLUENZA VACCINE  01/19/2020  . MAMMOGRAM  08/22/2021  . COLONOSCOPY  10/02/2021  . TETANUS/TDAP  05/23/2023  . DEXA SCAN  Completed  . COVID-19 Vaccine  Completed  . Hepatitis C Screening  Completed    Health Maintenance  Health Maintenance Due  Topic Date Due  . PNA vac Low Risk Adult (1 of 2 - PCV13) 10/28/2015  . INFLUENZA VACCINE  01/19/2020    Colorectal cancer screening: Completed 10/03/2011. Repeat every 10 years Mammogram status: Completed 08/23/2019. Repeat every year Bone Density status: Completed 01/10/2017. Results reflect: Bone density results: OSTEOPENIA. Repeat every 5 years.  Lung Cancer Screening: (Low Dose CT Chest recommended if Age 52-80 years, 30 pack-year currently smoking OR have quit w/in 15years.) does not qualify.   Lung Cancer Screening Referral: no  Additional Screening:  Hepatitis C Screening: does qualify; Completed 11/25/2016  Vision Screening: Recommended annual ophthalmology exams for early detection of glaucoma and other disorders of the eye. Is the patient up to date with their annual eye exam?  Yes  Who is the provider or what is the name of the office in which the patient attends annual eye exams? Dr. Lucretia RoersWood If pt is not  established with a  provider, would they like to be referred to a provider to establish care? No .   Dental Screening: Recommended annual dental exams for proper oral hygiene  Community Resource Referral / Chronic Care Management: CRR required this visit?  No   CCM required this visit?  No      Plan:     I have personally reviewed and noted the following in the patient's chart:   . Medical and social history . Use of alcohol, tobacco or illicit drugs  . Current medications and supplements . Functional ability and status . Nutritional status . Physical activity . Advanced directives . List of other physicians . Hospitalizations, surgeries, and ER visits in previous 12 months . Vitals . Screenings to include cognitive, depression, and falls . Referrals and appointments  In addition, I have reviewed and discussed with patient certain preventive protocols, quality metrics, and best practice recommendations. A written personalized care plan for preventive services as well as general preventive health recommendations were provided to patient.     Barb Merino, LPN   [phone]   Nurse Notes:

## 2020-05-08 LAB — CMP14+EGFR
ALT: 25 IU/L (ref 0–32)
AST: 18 IU/L (ref 0–40)
Albumin/Globulin Ratio: 1.1 — ABNORMAL LOW (ref 1.2–2.2)
Albumin: 3.8 g/dL (ref 3.8–4.8)
Alkaline Phosphatase: 103 IU/L (ref 44–121)
BUN/Creatinine Ratio: 21 (ref 12–28)
BUN: 17 mg/dL (ref 8–27)
Bilirubin Total: 0.3 mg/dL (ref 0.0–1.2)
CO2: 28 mmol/L (ref 20–29)
Calcium: 9.5 mg/dL (ref 8.7–10.3)
Chloride: 104 mmol/L (ref 96–106)
Creatinine, Ser: 0.8 mg/dL (ref 0.57–1.00)
GFR calc Af Amer: 87 mL/min/{1.73_m2} (ref >59)
GFR calc non Af Amer: 75 mL/min/{1.73_m2} (ref >59)
Globulin, Total: 3.6 g/dL (ref 1.5–4.5)
Glucose: 85 mg/dL (ref 65–99)
Potassium: 4.4 mmol/L (ref 3.5–5.2)
Sodium: 143 mmol/L (ref 134–144)
Total Protein: 7.4 g/dL (ref 6.0–8.5)

## 2020-05-08 LAB — LIPID PANEL
Chol/HDL Ratio: 2.2 ratio (ref 0.0–4.4)
Cholesterol, Total: 169 mg/dL (ref 100–199)
HDL: 78 mg/dL (ref >39)
LDL Chol Calc (NIH): 84 mg/dL (ref 0–99)
Triglycerides: 28 mg/dL (ref 0–149)
VLDL Cholesterol Cal: 7 mg/dL (ref 5–40)

## 2020-05-08 LAB — CBC
Hematocrit: 39.7 % (ref 34.0–46.6)
Hemoglobin: 13.2 g/dL (ref 11.1–15.9)
MCH: 30.6 pg (ref 26.6–33.0)
MCHC: 33.2 g/dL (ref 31.5–35.7)
MCV: 92 fL (ref 79–97)
Platelets: 213 10*3/uL (ref 150–450)
RBC: 4.32 x10E6/uL (ref 3.77–5.28)
RDW: 13 % (ref 11.7–15.4)
WBC: 5.5 10*3/uL (ref 3.4–10.8)

## 2020-05-08 LAB — VITAMIN D 25 HYDROXY (VIT D DEFICIENCY, FRACTURES): Vit D, 25-Hydroxy: 60.6 ng/mL (ref 30.0–100.0)

## 2020-07-27 ENCOUNTER — Other Ambulatory Visit: Payer: Self-pay | Admitting: Internal Medicine

## 2020-07-27 DIAGNOSIS — Z1231 Encounter for screening mammogram for malignant neoplasm of breast: Secondary | ICD-10-CM

## 2020-08-26 DIAGNOSIS — H40023 Open angle with borderline findings, high risk, bilateral: Secondary | ICD-10-CM | POA: Diagnosis not present

## 2020-09-17 ENCOUNTER — Other Ambulatory Visit: Payer: Self-pay

## 2020-09-17 ENCOUNTER — Ambulatory Visit
Admission: RE | Admit: 2020-09-17 | Discharge: 2020-09-17 | Disposition: A | Discharge status: Home / Self Care | Payer: Medicare Other | Source: Ambulatory Visit | Attending: Internal Medicine | Admitting: Internal Medicine

## 2020-09-17 DIAGNOSIS — Z1231 Encounter for screening mammogram for malignant neoplasm of breast: Secondary | ICD-10-CM | POA: Diagnosis not present

## 2020-09-26 ENCOUNTER — Other Ambulatory Visit: Payer: Self-pay

## 2020-09-26 ENCOUNTER — Encounter (HOSPITAL_BASED_OUTPATIENT_CLINIC_OR_DEPARTMENT_OTHER): Payer: Self-pay | Admitting: Emergency Medicine

## 2020-09-26 ENCOUNTER — Emergency Department (HOSPITAL_BASED_OUTPATIENT_CLINIC_OR_DEPARTMENT_OTHER)
Admission: EM | Admit: 2020-09-26 | Discharge: 2020-09-26 | Disposition: A | Discharge status: Home / Self Care | Payer: Medicare Other | Attending: Emergency Medicine | Admitting: Emergency Medicine

## 2020-09-26 DIAGNOSIS — Z79899 Other long term (current) drug therapy: Secondary | ICD-10-CM | POA: Insufficient documentation

## 2020-09-26 DIAGNOSIS — M5481 Occipital neuralgia: Secondary | ICD-10-CM | POA: Diagnosis not present

## 2020-09-26 DIAGNOSIS — I10 Essential (primary) hypertension: Secondary | ICD-10-CM | POA: Insufficient documentation

## 2020-09-26 DIAGNOSIS — M62838 Other muscle spasm: Secondary | ICD-10-CM | POA: Diagnosis not present

## 2020-09-26 DIAGNOSIS — R519 Headache, unspecified: Secondary | ICD-10-CM | POA: Diagnosis present

## 2020-09-26 MED ORDER — IBUPROFEN 800 MG PO TABS
800.0000 mg | ORAL_TABLET | Freq: Once | ORAL | Status: AC
  Administered 2020-09-26: 800 mg via ORAL
  Filled 2020-09-26: qty 1

## 2020-09-26 MED ORDER — CYCLOBENZAPRINE HCL 10 MG PO TABS: 10.0000 mg | ORAL_TABLET | Freq: Two times a day (BID) | ORAL | 0 refills | Status: DC | PRN

## 2020-09-26 MED ORDER — IBUPROFEN 800 MG PO TABS: 800.0000 mg | ORAL_TABLET | Freq: Three times a day (TID) | ORAL | 0 refills | Status: DC | PRN

## 2020-09-26 NOTE — ED Provider Notes (Signed)
[facility] EMERGENCY DEPARTMENT Provider Note  CSN: [id number] Arrival date & time: 09/26/20 0050    History Chief Complaint  Patient presents with  . Headache    HPI  Lauren Berry is a 70 y.o. female with history of HTN reports 3 days of intermittent sharp pain in the R posterior head, shoots up to her R scalp like lightning. No particularly provoking factors but she has noted some pain/swelling in the R occipital scalp and some tightness in her R shoulder muscles recently. She thought she might have been bitten by a bug and has been applying some peroxide and some topical oil which has helped some. No falls or injuries. No dizziness, blurry vision, numbness or weakness.    Past Medical History:  Diagnosis Date  . Anemia   . Blood transfusion   . Cataract   . Diverticulosis   . Hemorrhoids internal and external  . Hypertension     Past Surgical History:  Procedure Laterality Date  . CESAREAN SECTION  1976 and 1982  . COLONOSCOPY  10/03/2011   Procedure: COLONOSCOPY;  Surgeon: Charna Elizabeth, MD;  Location: [facility];  Service: Endoscopy;  Laterality: N/A;  . EYE SURGERY  2001   bilateral cataract    Family History  Problem Relation Age of Onset  . Breast cancer Mother 15  . Diabetes Father     Social History   Tobacco Use  . Smoking status: Never Smoker  . Smokeless tobacco: Never Used  Vaping Use  . Vaping Use: Never used  Substance Use Topics  . Alcohol use: No  . Drug use: No     Home Medications Prior to Admission medications   Medication Sig Start Date End Date Taking? Authorizing Provider  Ascorbic Acid (VITAMIN C) 100 MG tablet Take 100 mg by mouth daily.    [provider]  B Complex Vitamins (B COMPLEX 1 PO) Take by mouth.    [provider]  cholecalciferol (VITAMIN D) 1000 UNITS tablet Take 1,000 Units by mouth daily.    [provider]  Olmesartan-amLODIPine-HCTZ 40-10-12.5 MG TABS Take 1 tablet by  mouth daily. 05/07/20   Dorothyann Peng, MD     Allergies    Penicillins   Review of Systems   Review of Systems A comprehensive review of systems was completed and negative except as noted in HPI.    Physical Exam BP (!) 182/85 (BP Location: Right Arm)   Pulse 66   Temp 98.1 F (36.7 C) (Oral)   Resp 18   Ht 5\' 1"  (1.549 m)   Wt 81.6 kg   SpO2 99%   BMI 34.01 kg/m   Physical Exam Vitals and nursing note reviewed.  Constitutional:      Appearance: Normal appearance.  HENT:     Head: Normocephalic and atraumatic.     Comments: Mild tenderness to R occipital scalp, no erythema, induration or fluctuance    Nose: Nose normal.     Mouth/Throat:     Mouth: Mucous membranes are moist.  Eyes:     Extraocular Movements: Extraocular movements intact.     Conjunctiva/sclera: Conjunctivae normal.  Cardiovascular:     Rate and Rhythm: Normal rate.  Pulmonary:     Effort: Pulmonary effort is normal.     Breath sounds: Normal breath sounds.  Abdominal:     General: Abdomen is flat.     Palpations: Abdomen is soft.     Tenderness: There is no abdominal tenderness.  Musculoskeletal:  General: Tenderness (R trapezius muscle tight/tender) present. No swelling. Normal range of motion.     Cervical back: Neck supple.  Skin:    General: Skin is warm and dry.  Neurological:     General: No focal deficit present.     Mental Status: She is alert.  Psychiatric:        Mood and Affect: Mood normal.      ED Results / Procedures / Treatments   Labs (all labs ordered are listed, but only abnormal results are displayed) Labs Reviewed - No data to display  EKG None   Radiology No results found.  Procedures Procedures  Medications Ordered in the ED Medications  ibuprofen (ADVIL) tablet 800 mg (has no administration in time range)     MDM Rules/Calculators/A&P MDM Patient's symptoms most consistent with occipital neuralgia. I discussed this diagnosis with the  patient. Plan Rx for Motrin and flexeril for muscle spasm. Advised to follow up with PCP if not improving.  ED Course  I have reviewed the triage vital signs and the nursing notes.  Pertinent labs & imaging results that were available during my care of the patient were reviewed by me and considered in my medical decision making (see chart for details).     Final Clinical Impression(s) / ED Diagnoses Final diagnoses:  Occipital neuralgia of right side    Rx / DC Orders ED Discharge Orders    None       Pollyann Savoy, MD 09/26/20 [phone]

## 2020-09-26 NOTE — ED Triage Notes (Signed)
  Patient comes in with sore spot on back of her head.  Patient states it has been going on for about 2 days and is off and on with pain.  Patient was concerned she was bitten by something but not sure.  Seems more musculoskeletal.  Sharp intermittent pains. Pain 4/10.  No headache, or vision changes.  States it seems to get worse at night.

## 2020-11-04 ENCOUNTER — Ambulatory Visit: Payer: Medicare Other

## 2020-11-04 ENCOUNTER — Ambulatory Visit: Payer: Medicare Other | Admitting: Internal Medicine

## 2020-11-04 ENCOUNTER — Encounter: Payer: Medicare Other | Admitting: Internal Medicine

## 2020-11-26 ENCOUNTER — Encounter: Payer: Self-pay | Admitting: Internal Medicine

## 2020-11-26 ENCOUNTER — Other Ambulatory Visit: Payer: Self-pay

## 2020-11-26 ENCOUNTER — Ambulatory Visit (INDEPENDENT_AMBULATORY_CARE_PROVIDER_SITE_OTHER): Payer: Medicare Other

## 2020-11-26 ENCOUNTER — Ambulatory Visit (INDEPENDENT_AMBULATORY_CARE_PROVIDER_SITE_OTHER): Payer: Medicare Other | Admitting: Internal Medicine

## 2020-11-26 VITALS — BP 138/86 | HR 62 | Temp 98.5°F | Ht 61.0 in | Wt 204.4 lb

## 2020-11-26 VITALS — BP 156/98 | HR 78 | Temp 98.5°F | Ht 61.0 in | Wt 204.0 lb

## 2020-11-26 DIAGNOSIS — Z23 Encounter for immunization: Secondary | ICD-10-CM

## 2020-11-26 DIAGNOSIS — Z0000 Encounter for general adult medical examination without abnormal findings: Secondary | ICD-10-CM | POA: Diagnosis not present

## 2020-11-26 DIAGNOSIS — I10 Essential (primary) hypertension: Secondary | ICD-10-CM

## 2020-11-26 DIAGNOSIS — Z6836 Body mass index (BMI) 36.0-36.9, adult: Secondary | ICD-10-CM | POA: Diagnosis not present

## 2020-11-26 DIAGNOSIS — R7309 Other abnormal glucose: Secondary | ICD-10-CM

## 2020-11-26 MED ORDER — PREVNAR 20 0.5 ML IM SUSY: 0.5000 mL | PREFILLED_SYRINGE | INTRAMUSCULAR | 0 refills | Status: AC

## 2020-11-26 MED ORDER — OLMESARTAN-AMLODIPINE-HCTZ 40-10-12.5 MG PO TABS
1.0000 | ORAL_TABLET | Freq: Every day | ORAL | 2 refills | Status: DC
Start: 2020-11-26 — End: 2021-11-02

## 2020-11-26 NOTE — Progress Notes (Signed)
This visit occurred during the SARS-CoV-2 public health emergency.  Safety protocols were in place, including screening questions prior to the visit, additional usage of staff PPE, and extensive cleaning of exam room while observing appropriate contact time as indicated for disinfecting solutions.  Subjective:   Lauren Berry is a 70 y.o. female who presents for Medicare Annual (Subsequent) preventive examination.  Review of Systems     Cardiac Risk Factors include: advanced age (>6men, >12 women);hypertension;obesity (BMI >30kg/m2)     Objective:    Today's Vitals   11/26/20 0956  BP: (!) 156/98  Pulse: 78  Temp: 98.5 F (36.9 C)  TempSrc: Oral  Weight: 204 lb (92.5 kg)  Height: 5\' 1"  (1.549 m)  PainSc: 4    Body mass index is 38.55 kg/m.  Advanced Directives 11/26/2020 09/26/2020 05/07/2020 05/02/2019 04/26/2018 10/03/2011  Does Patient Have a Medical Advance Directive? Yes Yes Yes Yes Yes Patient does not have advance directive  Type of Advance Directive Healthcare Power of [place];Living will Healthcare Power of [place] Power of [employer] Power of [employer] Power of [place];Living will -  Does patient want to make changes to medical advance directive? - - - - No - Patient declined -  Copy of Healthcare Power of Attorney in Chart? No - copy requested No - copy requested No - copy requested No - copy requested No - copy requested -    Current Medications (verified) Outpatient Encounter Medications as of 11/26/2020  Medication Sig   acetaminophen (TYLENOL) 500 MG tablet Take 500 mg by mouth every 6 (six) hours as needed.   Ascorbic Acid (VITAMIN C) 100 MG tablet Take 100 mg by mouth daily.   B Complex Vitamins (B COMPLEX 1 PO) Take by mouth.   cholecalciferol (VITAMIN D) 1000 UNITS tablet Take 1,000 Units by mouth daily.   Olmesartan-amLODIPine-HCTZ 40-10-12.5 MG TABS Take 1 tablet by mouth daily.   cyclobenzaprine (FLEXERIL) 10 MG tablet Take  1 tablet (10 mg total) by mouth 2 (two) times daily as needed for muscle spasms. (Patient not taking: Reported on 11/26/2020)   ibuprofen (ADVIL) 800 MG tablet Take 1 tablet (800 mg total) by mouth every 8 (eight) hours as needed for moderate pain. (Patient not taking: Reported on 11/26/2020)   No facility-administered encounter medications on file as of 11/26/2020.    Allergies (verified) Penicillins   History: Past Medical History:  Diagnosis Date   Anemia    Blood transfusion    Cataract    Diverticulosis    Hemorrhoids internal and external   Hypertension    Past Surgical History:  Procedure Laterality Date   CESAREAN SECTION  1976 and 1982   COLONOSCOPY  10/03/2011   Procedure: COLONOSCOPY;  Surgeon: 10/05/2011, MD;  Location: [facility];  Service: Endoscopy;  Laterality: N/A;   EYE SURGERY  2001   bilateral cataract   Family History  Problem Relation Age of Onset   Breast cancer Mother 34   Diabetes Father    Social History   Socioeconomic History   Marital status: Married    Spouse name: Not on file   Number of children: Not on file   Years of education: Not on file   Highest education level: Not on file  Occupational History   Occupation: retired  Tobacco Use   Smoking status: Never   Smokeless tobacco: Never  Vaping Use   Vaping Use: Never used  Substance and Sexual Activity   Alcohol use: No   Drug  use: No   Sexual activity: Not Currently  Other Topics Concern   Not on file  Social History Narrative   Not on file   Social Determinants of Health   Financial Resource Strain: Low Risk    Difficulty of Paying Living Expenses: Not hard at all  Food Insecurity: No Food Insecurity   Worried About Programme researcher, broadcasting/film/videounning Out of Food in the Last Year: Never true   Ran Out of Food in the Last Year: Never true  Transportation Needs: No Transportation Needs   Lack of Transportation (Medical): No   Lack of Transportation (Non-Medical): No  Physical Activity: Sufficiently  Active   Days of Exercise per Week: 7 days   Minutes of Exercise per Session: 40 min  Stress: No Stress Concern Present   Feeling of Stress : Not at all  Social Connections: Not on file    Tobacco Counseling Counseling given: Not Answered   Clinical Intake:  Pre-visit preparation completed: Yes  Pain : 0-10 Pain Score: 4  Pain Type: Acute pain Pain Location: Face Pain Orientation: Left Pain Descriptors / Indicators: Aching Pain Onset: Today Pain Frequency: Constant     Nutritional Status: BMI > 30  Obese Nutritional Risks: None Diabetes: No  How often do you need to have someone help you when you read instructions, pamphlets, or other written materials from your doctor or pharmacy?: 1 - Never What is the last grade level you completed in school?: college  Diabetic?no  Interpreter Needed?: No  Information entered by :: NAllen LPN   Activities of Daily Living In your present state of health, do you have any difficulty performing the following activities: 11/26/2020 05/07/2020  Hearing? N N  Vision? N N  Difficulty concentrating or making decisions? N N  Walking or climbing stairs? N N  Dressing or bathing? N N  Doing errands, shopping? N N  Preparing Food and eating ? N N  Using the Toilet? N N  In the past six months, have you accidently leaked urine? Y N  Do you have problems with loss of bowel control? N N  Managing your Medications? N N  Managing your Finances? N N  Housekeeping or managing your Housekeeping? N N  Some recent data might be hidden    Patient Care Team: Dorothyann PengSanders, Robyn, MD as PCP - General (Internal Medicine) Shea Evansunn, Genice RougePeter K [facility])  Indicate any recent Medical Services you may have received from other than [facility] providers in the past year (date may be approximate).     Assessment:   This is a routine wellness examination for Lauren Berry.  Hearing/Vision screen No results found.  Dietary issues and exercise activities  discussed: Current Exercise Habits: Home exercise routine, Type of exercise: walking, Time (Minutes): 45, Frequency (Times/Week): 7, Weekly Exercise (Minutes/Week): 315   Goals Addressed             This Visit's Progress    Patient Stated       11/26/2020, wants to lose 30 pounds        Depression Screen PHQ 2/9 Scores 11/26/2020 05/07/2020 05/07/2020 05/02/2019 12/05/2018 11/27/2018 07/30/2018  PHQ - 2 Score 0 0 0 0 0 0 0  PHQ- 9 Score - - - 3 - - -    Fall Risk Fall Risk  11/26/2020 05/07/2020 05/07/2020 05/02/2019 05/02/2019  Falls in the past year? 0 1 1 0 0  Number falls in past yr: - 0 0 0 -  Injury with Fall? - 1 1 - -  Comment - - brusied her right shoulder, she fell in the tub because she saw a spider - -  Risk for fall due to : Medication side effect Medication side effect - Medication side effect -  Follow up Falls evaluation completed;Education provided;Falls prevention discussed Falls evaluation completed;Education provided;Falls prevention discussed - Falls evaluation completed;Education provided;Falls prevention discussed -    FALL RISK PREVENTION PERTAINING TO THE HOME:  Any stairs in or around the home? Yes  If so, are there any without handrails? No  Home free of loose throw rugs in walkways, pet beds, electrical cords, etc? Yes  Adequate lighting in your home to reduce risk of falls? Yes   ASSISTIVE DEVICES UTILIZED TO PREVENT FALLS:  Life alert? No  Use of a cane, walker or w/c? No  Grab bars in the bathroom? No  Shower chair or bench in shower? Yes  Elevated toilet seat or a handicapped toilet? No   TIMED UP AND GO:  Was the test performed? No .    Gait steady and fast without use of assistive device  Cognitive Function:     6CIT Screen 11/26/2020 05/07/2020 05/02/2019 04/26/2018  What Year? 0 points 0 points 0 points 0 points  What month? 0 points 0 points 0 points 0 points  What time? 0 points 0 points 0 points 0 points  Count back from 20 0  points 0 points 0 points 0 points  Months in reverse 0 points 0 points 0 points 0 points  Repeat phrase 0 points 0 points 2 points 0 points  Total Score 0 0 2 0    Immunizations Immunization History  Administered Date(s) Administered   Influenza, High Dose Seasonal PF 03/23/2018, 05/02/2019   PFIZER(Purple Top)SARS-COV-2 Vaccination 08/01/2019, 08/26/2019   Pneumococcal Polysaccharide-23 04/30/2012   Zoster Recombinat (Shingrix) 03/23/2018, 08/26/2018    TDAP status: Up to date  Flu Vaccine status: Up to date  Pneumococcal vaccine status: Due, Education has been provided regarding the importance of this vaccine. Advised may receive this vaccine at local pharmacy or Health Dept. Aware to provide a copy of the vaccination record if obtained from local pharmacy or Health Dept. Verbalized acceptance and understanding.   Covid-19 vaccine status: Completed vaccines  Qualifies for Shingles Vaccine? Yes   Zostavax completed No   Shingrix Completed?: Yes  Screening Tests Health Maintenance  Topic Date Due   PNA vac Low Risk Adult (1 of 2 - PCV13) 10/28/2015   COVID-19 Vaccine (3 - Booster for Pfizer series) 01/26/2020   INFLUENZA VACCINE  01/18/2021   COLONOSCOPY (Pts 45-74yrs Insurance coverage will need to be confirmed)  10/02/2021   MAMMOGRAM  09/18/2022   TETANUS/TDAP  05/23/2023   DEXA SCAN  Completed   Hepatitis C Screening  Completed   Zoster Vaccines- Shingrix  Completed   Pneumococcal Vaccine 8-44 Years old  Aged Out   HPV VACCINES  Aged Out    Health Maintenance  Health Maintenance Due  Topic Date Due   PNA vac Low Risk Adult (1 of 2 - PCV13) 10/28/2015   COVID-19 Vaccine (3 - Booster for Pfizer series) 01/26/2020    Colorectal cancer screening: Type of screening: Colonoscopy. Completed 10/03/2011. Repeat every 10 years  Mammogram status: Completed 09/17/2020. Repeat every year  Bone Density status: Completed 01/10/2017.   Lung Cancer Screening: (Low Dose CT  Chest recommended if Age 62-80 years, 30 pack-year currently smoking OR have quit w/in 15years.) does not qualify.   Lung Cancer Screening Referral: no  Additional Screening:  Hepatitis C Screening: does qualify; Completed 11/25/2016  Vision Screening: Recommended annual ophthalmology exams for early detection of glaucoma and other disorders of the eye. Is the patient up to date with their annual eye exam?  Yes  Who is the provider or what is the name of the office in which the patient attends annual eye exams? [facility] If pt is not established with a provider, would they like to be referred to a provider to establish care? No .   Dental Screening: Recommended annual dental exams for proper oral hygiene  Community Resource Referral / Chronic Care Management: CRR required this visit?  No   CCM required this visit?  No      Plan:     I have personally reviewed and noted the following in the patient's chart:   Medical and social history Use of alcohol, tobacco or illicit drugs  Current medications and supplements including opioid prescriptions.  Functional ability and status Nutritional status Physical activity Advanced directives List of other physicians Hospitalizations, surgeries, and ER visits in previous 12 months Vitals Screenings to include cognitive, depression, and falls Referrals and appointments  In addition, I have reviewed and discussed with patient certain preventive protocols, quality metrics, and best practice recommendations. A written personalized care plan for preventive services as well as general preventive health recommendations were provided to patient.     Barb Merino, LPN   07/25/4268   Nurse Notes:

## 2020-11-26 NOTE — Patient Instructions (Signed)
Ms. Lauren Berry , Thank you for taking time to come for your Medicare Wellness Visit. I appreciate your ongoing commitment to your health goals. Please review the following plan we discussed and let me know if I can assist you in the future.   Screening recommendations/referrals: Colonoscopy: completed 10/03/2011 Mammogram: completed 09/17/2020 Bone Density: completed 01/10/2017 Recommended yearly ophthalmology/optometry visit for glaucoma screening and checkup Recommended yearly dental visit for hygiene and checkup  Vaccinations: Influenza vaccine: due 01/18/2021 Pneumococcal vaccine: sent to pharmacy Tdap vaccine: completed 05/22/2013, due 05/23/2023 Shingles vaccine: completed   Covid-19:03/19/2020, 08/26/2019, 08/01/2019  Advanced directives: Please bring a copy of your POA (Power of Attorney) and/or Living Will to your next appointment.   Conditions/risks identified: none  Next appointment: Follow up in one year for your annual wellness visit    Preventive Care 65 Years and Older, Female Preventive care refers to lifestyle choices and visits with your health care provider that can promote health and wellness. What does preventive care include? A yearly physical exam. This is also called an annual well check. Dental exams once or twice a year. Routine eye exams. Ask your health care provider how often you should have your eyes checked. Personal lifestyle choices, including: Daily care of your teeth and gums. Regular physical activity. Eating a healthy diet. Avoiding tobacco and drug use. Limiting alcohol use. Practicing safe sex. Taking low-dose aspirin every day. Taking vitamin and mineral supplements as recommended by your health care provider. What happens during an annual well check? The services and screenings done by your health care provider during your annual well check will depend on your age, overall health, lifestyle risk factors, and family history of disease. Counseling   Your health care provider may ask you questions about your: Alcohol use. Tobacco use. Drug use. Emotional well-being. Home and relationship well-being. Sexual activity. Eating habits. History of falls. Memory and ability to understand (cognition). Work and work Astronomer. Reproductive health. Screening  You may have the following tests or measurements: Height, weight, and BMI. Blood pressure. Lipid and cholesterol levels. These may be checked every 5 years, or more frequently if you are over 32 years old. Skin check. Lung cancer screening. You may have this screening every year starting at age 53 if you have a 30-pack-year history of smoking and currently smoke or have quit within the past 15 years. Fecal occult blood test (FOBT) of the stool. You may have this test every year starting at age 61. Flexible sigmoidoscopy or colonoscopy. You may have a sigmoidoscopy every 5 years or a colonoscopy every 10 years starting at age 70. Hepatitis C blood test. Hepatitis B blood test. Sexually transmitted disease (STD) testing. Diabetes screening. This is done by checking your blood sugar (glucose) after you have not eaten for a while (fasting). You may have this done every 1-3 years. Bone density scan. This is done to screen for osteoporosis. You may have this done starting at age 45. Mammogram. This may be done every 1-2 years. Talk to your health care provider about how often you should have regular mammograms. Talk with your health care provider about your test results, treatment options, and if necessary, the need for more tests. Vaccines  Your health care provider may recommend certain vaccines, such as: Influenza vaccine. This is recommended every year. Tetanus, diphtheria, and acellular pertussis (Tdap, Td) vaccine. You may need a Td booster every 10 years. Zoster vaccine. You may need this after age 74. Pneumococcal 13-valent conjugate (PCV13) vaccine. One dose  is recommended  after age 49. Pneumococcal polysaccharide (PPSV23) vaccine. One dose is recommended after age 44. Talk to your health care provider about which screenings and vaccines you need and how often you need them. This information is not intended to replace advice given to you by your health care provider. Make sure you discuss any questions you have with your health care provider. Document Released: 07/03/2015 Document Revised: 02/24/2016 Document Reviewed: 04/07/2015 Elsevier Interactive Patient Education  2017 [place] Prevention in the Home Falls can cause injuries. They can happen to people of all ages. There are many things you can do to make your home safe and to help prevent falls. What can I do on the outside of my home? Regularly fix the edges of walkways and driveways and fix any cracks. Remove anything that might make you trip as you walk through a door, such as a raised step or threshold. Trim any bushes or trees on the path to your home. Use bright outdoor lighting. Clear any walking paths of anything that might make someone trip, such as rocks or tools. Regularly check to see if handrails are loose or broken. Make sure that both sides of any steps have handrails. Any raised decks and porches should have guardrails on the edges. Have any leaves, snow, or ice cleared regularly. Use sand or salt on walking paths during winter. Clean up any spills in your garage right away. This includes oil or grease spills. What can I do in the bathroom? Use night lights. Install grab bars by the toilet and in the tub and shower. Do not use towel bars as grab bars. Use non-skid mats or decals in the tub or shower. If you need to sit down in the shower, use a plastic, non-slip stool. Keep the floor dry. Clean up any water that spills on the floor as soon as it happens. Remove soap buildup in the tub or shower regularly. Attach bath mats securely with double-sided non-slip rug tape. Do not  have throw rugs and other things on the floor that can make you trip. What can I do in the bedroom? Use night lights. Make sure that you have a light by your bed that is easy to reach. Do not use any sheets or blankets that are too big for your bed. They should not hang down onto the floor. Have a firm chair that has side arms. You can use this for support while you get dressed. Do not have throw rugs and other things on the floor that can make you trip. What can I do in the kitchen? Clean up any spills right away. Avoid walking on wet floors. Keep items that you use a lot in easy-to-reach places. If you need to reach something above you, use a strong step stool that has a grab bar. Keep electrical cords out of the way. Do not use floor polish or wax that makes floors slippery. If you must use wax, use non-skid floor wax. Do not have throw rugs and other things on the floor that can make you trip. What can I do with my stairs? Do not leave any items on the stairs. Make sure that there are handrails on both sides of the stairs and use them. Fix handrails that are broken or loose. Make sure that handrails are as long as the stairways. Check any carpeting to make sure that it is firmly attached to the stairs. Fix any carpet that is loose or worn. Avoid  having throw rugs at the top or bottom of the stairs. If you do have throw rugs, attach them to the floor with carpet tape. Make sure that you have a light switch at the top of the stairs and the bottom of the stairs. If you do not have them, ask someone to add them for you. What else can I do to help prevent falls? Wear shoes that: Do not have high heels. Have rubber bottoms. Are comfortable and fit you well. Are closed at the toe. Do not wear sandals. If you use a stepladder: Make sure that it is fully opened. Do not climb a closed stepladder. Make sure that both sides of the stepladder are locked into place. Ask someone to hold it for  you, if possible. Clearly mark and make sure that you can see: Any grab bars or handrails. First and last steps. Where the edge of each step is. Use tools that help you move around (mobility aids) if they are needed. These include: Canes. Walkers. Scooters. Crutches. Turn on the lights when you go into a dark area. Replace any light bulbs as soon as they burn out. Set up your furniture so you have a clear path. Avoid moving your furniture around. If any of your floors are uneven, fix them. If there are any pets around you, be aware of where they are. Review your medicines with your doctor. Some medicines can make you feel dizzy. This can increase your chance of falling. Ask your doctor what other things that you can do to help prevent falls. This information is not intended to replace advice given to you by your health care provider. Make sure you discuss any questions you have with your health care provider. Document Released: 04/02/2009 Document Revised: 11/12/2015 Document Reviewed: 07/11/2014 Elsevier Interactive Patient Education  2017 [employer].

## 2020-11-26 NOTE — Patient Instructions (Signed)

## 2020-11-26 NOTE — Progress Notes (Signed)
I,Katawbba Wiggins,acting as a Education administrator for Maximino Greenland, MD.,have documented all relevant documentation on the behalf of Maximino Greenland, MD,as directed by  Maximino Greenland, MD while in the presence of Maximino Greenland, MD.  This visit occurred during the SARS-CoV-2 public health emergency.  Safety protocols were in place, including screening questions prior to the visit, additional usage of staff PPE, and extensive cleaning of exam room while observing appropriate contact time as indicated for disinfecting solutions.  Subjective:     Patient ID: Lauren Berry , female    DOB: 1950/11/27 , 70 y.o.   MRN: [id number]   Chief Complaint  Patient presents with   Hypertension     HPI  She is here today for BP check.  She reports compliance with meds. She denies headaches, chest pain and shortness of breath. She states she just returned from [place] yesterday. She was spending time with family. She is also scheduled for AWV visit with [facility].   Hypertension This is a chronic problem. The current episode started more than 1 year ago. The problem has been gradually improving since onset. The problem is controlled. Pertinent negatives include no blurred vision, chest pain, palpitations or shortness of breath. Risk factors for coronary artery disease include dyslipidemia, obesity and post-menopausal state.    Past Medical History:  Diagnosis Date   Anemia    Blood transfusion    Cataract    Diverticulosis    Hemorrhoids internal and external   Hypertension      Family History  Problem Relation Age of Onset   Breast cancer Mother 50   Diabetes Father      Current Outpatient Medications:    acetaminophen (TYLENOL) 500 MG tablet, Take 500 mg by mouth every 6 (six) hours as needed., Disp: , Rfl:    Ascorbic Acid (VITAMIN C) 100 MG tablet, Take 100 mg by mouth daily., Disp: , Rfl:    B Complex Vitamins (B COMPLEX 1 PO), Take by mouth., Disp: , Rfl:    cholecalciferol (VITAMIN D)  1000 UNITS tablet, Take 1,000 Units by mouth daily., Disp: , Rfl:    cyclobenzaprine (FLEXERIL) 10 MG tablet, Take 1 tablet (10 mg total) by mouth 2 (two) times daily as needed for muscle spasms. (Patient not taking: Reported on 11/26/2020), Disp: 20 tablet, Rfl: 0   ibuprofen (ADVIL) 800 MG tablet, Take 1 tablet (800 mg total) by mouth every 8 (eight) hours as needed for moderate pain. (Patient not taking: Reported on 11/26/2020), Disp: 30 tablet, Rfl: 0   Olmesartan-amLODIPine-HCTZ 40-10-12.5 MG TABS, Take 1 tablet by mouth daily., Disp: 90 tablet, Rfl: 2   pneumococcal 20-Val Conj Vacc (PREVNAR 20) 0.5 ML SUSY, Inject 0.5 mLs into the muscle tomorrow at 10 am for 1 dose., Disp: 0.5 mL, Rfl: 0   Allergies  Allergen Reactions   Penicillins      Review of Systems  Constitutional: Negative.   Eyes:  Negative for blurred vision.  Respiratory: Negative.  Negative for shortness of breath.   Cardiovascular: Negative.  Negative for chest pain and palpitations.  Gastrointestinal: Negative.   Psychiatric/Behavioral: Negative.    All other systems reviewed and are negative.   Today's Vitals   11/26/20 0937 11/26/20 1028  BP: (!) 156/98 138/86  Pulse: 78 62  Temp: 98.5 F (36.9 C)   TempSrc: Oral   Weight: 204 lb 6.4 oz (92.7 kg)   Height: _0  (1.549 m)   PainSc: 0-No pain  Body mass index is 38.62 kg/m.  Wt Readings from Last 3 Encounters:  11/26/20 204 lb (92.5 kg)  11/26/20 204 lb 6.4 oz (92.7 kg)  09/26/20 180 lb (81.6 kg)    BP Readings from Last 3 Encounters:  11/26/20 (!) 156/98  11/26/20 138/86  09/26/20 (!) 182/85    Objective:  Physical Exam Vitals and nursing note reviewed.  Constitutional:      Appearance: Normal appearance.  HENT:     Head: Normocephalic and atraumatic.     Nose:     Comments: Masked     Mouth/Throat:     Comments: Masked  Cardiovascular:     Rate and Rhythm: Normal rate and regular rhythm.     Heart sounds: Normal heart sounds.  Pulmonary:      Effort: Pulmonary effort is normal.     Breath sounds: Normal breath sounds.  Skin:    General: Skin is warm.  Neurological:     General: No focal deficit present.     Mental Status: She is alert.  Psychiatric:        Mood and Affect: Mood normal.        Behavior: Behavior normal.        Assessment And Plan:     1. Essential hypertension, benign Comments: Chronic, initially uncontrolled. Thinks its due to travel fatigue. Repeat BP 138/86. I will not make any changes today. She will f/u CPE in Nov 2022.  - CMP14+EGFR - TSH - Insulin, random(561) - Olmesartan-amLODIPine-HCTZ 40-10-12.5 MG TABS; Take 1 tablet by mouth daily.  Dispense: 90 tablet; Refill: 2  2. Other abnormal glucose Comments: Her a1c has been elevated in the past. I will check an a1c today. She is encouraged to limit her intake of sweetened beverages.  - Hemoglobin A1c  3. Class 2 severe obesity due to excess calories with serious comorbidity and body mass index (BMI) of 36.0 to 36.9 in adult [facility]) Comments: She has lost 26 pounds since May 2021, totalling 77 pounds since 2019. Encouraged to aim for at least 150 minutes of exercise per week.    Patient was given opportunity to ask questions. Patient verbalized understanding of the plan and was able to repeat key elements of the plan. All questions were answered to their satisfaction.   I, Maximino Greenland, MD, have reviewed all documentation for this visit. The documentation on 11/26/20 for the exam, diagnosis, procedures, and orders are all accurate and complete.   IF YOU HAVE BEEN REFERRED TO A SPECIALIST, IT MAY TAKE 1-2 WEEKS TO SCHEDULE/PROCESS THE REFERRAL. IF YOU HAVE NOT HEARD FROM US/SPECIALIST IN TWO WEEKS, PLEASE GIVE [place] A CALL AT [phone].   THE PATIENT IS ENCOURAGED TO PRACTICE SOCIAL DISTANCING DUE TO THE COVID-19 PANDEMIC.

## 2020-11-27 LAB — CMP14+EGFR
ALT: 23 IU/L (ref 0–32)
AST: 16 IU/L (ref 0–40)
Albumin/Globulin Ratio: 1.2 (ref 1.2–2.2)
Albumin: 3.8 g/dL (ref 3.8–4.8)
Alkaline Phosphatase: 96 IU/L (ref 44–121)
BUN/Creatinine Ratio: 16 (ref 12–28)
BUN: 12 mg/dL (ref 8–27)
Bilirubin Total: 0.3 mg/dL (ref 0.0–1.2)
CO2: 25 mmol/L (ref 20–29)
Calcium: 10.1 mg/dL (ref 8.7–10.3)
Chloride: 100 mmol/L (ref 96–106)
Creatinine, Ser: 0.76 mg/dL (ref 0.57–1.00)
Globulin, Total: 3.3 g/dL (ref 1.5–4.5)
Glucose: 80 mg/dL (ref 65–99)
Potassium: 4.3 mmol/L (ref 3.5–5.2)
Sodium: 140 mmol/L (ref 134–144)
Total Protein: 7.1 g/dL (ref 6.0–8.5)
eGFR: 84 mL/min/{1.73_m2} (ref >59)

## 2020-11-27 LAB — INSULIN, RANDOM: INSULIN: 7.7 u[IU]/mL (ref 2.6–24.9)

## 2020-11-27 LAB — HEMOGLOBIN A1C
Est. average glucose Bld gHb Est-mCnc: 111 mg/dL
Hgb A1c MFr Bld: 5.5 % (ref 4.8–5.6)

## 2020-11-27 LAB — TSH: TSH: 2.67 u[IU]/mL (ref 0.450–4.500)

## 2021-05-18 ENCOUNTER — Encounter: Payer: Medicare Other | Admitting: Internal Medicine

## 2021-07-27 ENCOUNTER — Encounter: Payer: Self-pay | Admitting: Internal Medicine

## 2021-07-27 ENCOUNTER — Ambulatory Visit (INDEPENDENT_AMBULATORY_CARE_PROVIDER_SITE_OTHER): Payer: Medicare Other | Admitting: Internal Medicine

## 2021-07-27 ENCOUNTER — Other Ambulatory Visit: Payer: Self-pay

## 2021-07-27 VITALS — BP 160/82 | HR 83 | Temp 98.5°F | Ht 60.8 in | Wt 227.4 lb

## 2021-07-27 DIAGNOSIS — H6121 Impacted cerumen, right ear: Secondary | ICD-10-CM

## 2021-07-27 DIAGNOSIS — E559 Vitamin D deficiency, unspecified: Secondary | ICD-10-CM

## 2021-07-27 DIAGNOSIS — I10 Essential (primary) hypertension: Secondary | ICD-10-CM | POA: Diagnosis not present

## 2021-07-27 DIAGNOSIS — R7309 Other abnormal glucose: Secondary | ICD-10-CM | POA: Diagnosis not present

## 2021-07-27 DIAGNOSIS — Z0000 Encounter for general adult medical examination without abnormal findings: Secondary | ICD-10-CM

## 2021-07-27 DIAGNOSIS — Z23 Encounter for immunization: Secondary | ICD-10-CM

## 2021-07-27 LAB — POCT URINALYSIS DIPSTICK
Bilirubin, UA: NEGATIVE
Glucose, UA: NEGATIVE
Ketones, UA: NEGATIVE
Leukocytes, UA: NEGATIVE
Nitrite, UA: NEGATIVE
Protein, UA: NEGATIVE
Spec Grav, UA: 1.015 (ref 1.010–1.025)
Urobilinogen, UA: 0.2 E.U./dL
pH, UA: 6 (ref 5.0–8.0)

## 2021-07-27 NOTE — Patient Instructions (Signed)

## 2021-07-27 NOTE — Progress Notes (Signed)
Rich Brave Llittleton,acting as a Education administrator for Maximino Greenland, MD.,have documented all relevant documentation on the behalf of Maximino Greenland, MD,as directed by  Maximino Greenland, MD while in the presence of Maximino Greenland, MD.  This visit occurred during the SARS-CoV-2 public health emergency.  Safety protocols were in place, including screening questions prior to the visit, additional usage of staff PPE, and extensive cleaning of exam room while observing appropriate contact time as indicated for disinfecting solutions.  Subjective:     Patient ID: Lauren Berry , female    DOB: November 27, 1950 , 71 y.o.   MRN: [id number]   Chief Complaint  Patient presents with   Annual Exam   Hypertension    HPI  She is here today for a full physical examination. Her last pap smear was in 2020. She reports compliance with meds. Denies headaches, chest pain and shortness of breath. She has no specific concerns at this time.  She states she was in [place] with her son for 2-3 months and she got off track. She states this is what contributed to her weight gain.  Hypertension This is a chronic problem. The current episode started more than 1 year ago. The problem has been gradually improving since onset. The problem is controlled. Pertinent negatives include no blurred vision, palpitations or shortness of breath. Risk factors for coronary artery disease include obesity, post-menopausal state and sedentary lifestyle. Past treatments include angiotensin blockers, diuretics and calcium channel blockers. The current treatment provides moderate improvement.    Past Medical History:  Diagnosis Date   Anemia    Blood transfusion    Cataract    Diverticulosis    Hemorrhoids internal and external   Hypertension      Family History  Problem Relation Age of Onset   Breast cancer Mother 16   Diabetes Father      Current Outpatient Medications:    acetaminophen (TYLENOL) 500 MG tablet, Take 500 mg by mouth  every 6 (six) hours as needed., Disp: , Rfl:    Ascorbic Acid (VITAMIN C) 100 MG tablet, Take 100 mg by mouth daily., Disp: , Rfl:    B Complex Vitamins (B COMPLEX 1 PO), Take by mouth., Disp: , Rfl:    cholecalciferol (VITAMIN D) 1000 UNITS tablet, Take 1,000 Units by mouth daily., Disp: , Rfl:    Olmesartan-amLODIPine-HCTZ 40-10-12.5 MG TABS, Take 1 tablet by mouth daily., Disp: 90 tablet, Rfl: 2   Allergies  Allergen Reactions   Penicillins      The patient states she uses post menopausal status for birth control. Last LMP was No LMP recorded. Patient is postmenopausal.. Negative for Dysmenorrhea. Negative for: breast discharge, breast lump(s), breast pain and breast self exam. Associated symptoms include abnormal vaginal bleeding. Pertinent negatives include abnormal bleeding (hematology), anxiety, decreased libido, depression, difficulty falling sleep, dyspareunia, history of infertility, nocturia, sexual dysfunction, sleep disturbances, urinary incontinence, urinary urgency, vaginal discharge and vaginal itching. Diet regular.The patient states her exercise level is  minimal.  . The patient's tobacco use is:  Social History   Tobacco Use  Smoking Status Never  Smokeless Tobacco Never  . She has been exposed to passive smoke. The patient's alcohol use is:  Social History   Substance and Sexual Activity  Alcohol Use No   Review of Systems  Constitutional:  Positive for unexpected weight change.  HENT: Negative.    Eyes: Negative.  Negative for blurred vision.  Respiratory: Negative.  Negative for shortness of  breath.   Cardiovascular: Negative.  Negative for palpitations.  Gastrointestinal: Negative.   Endocrine: Negative.   Genitourinary: Negative.   Musculoskeletal: Negative.   Skin: Negative.   Allergic/Immunologic: Negative.   Neurological: Negative.   Hematological: Negative.   Psychiatric/Behavioral: Negative.      Today's Vitals   07/27/21 1430 07/27/21 1538  BP:  (!) 162/100 (!) 160/82  Pulse: 83   Temp: 98.5 F (36.9 C)   Weight: 227 lb 6.4 oz (103.1 kg)   Height: 5' 0.8" (1.544 m)   PainSc: 0-No pain    Body mass index is 43.25 kg/m.  Wt Readings from Last 3 Encounters:  07/27/21 227 lb 6.4 oz (103.1 kg)  11/26/20 204 lb (92.5 kg)  11/26/20 204 lb 6.4 oz (92.7 kg)   BP Readings from Last 3 Encounters:  07/27/21 (!) 160/82  11/26/20 (!) 156/98  11/26/20 138/86      Objective:  Physical Exam Vitals and nursing note reviewed.  Constitutional:      Appearance: Normal appearance. She is obese.  HENT:     Head: Normocephalic and atraumatic.     Right Ear: Ear canal and external ear normal. There is impacted cerumen.     Left Ear: Tympanic membrane, ear canal and external ear normal.     Nose:     Comments: Masked     Mouth/Throat:     Comments: Masked  Eyes:     Extraocular Movements: Extraocular movements intact.     Conjunctiva/sclera: Conjunctivae normal.     Pupils: Pupils are equal, round, and reactive to light.  Cardiovascular:     Rate and Rhythm: Normal rate and regular rhythm.     Pulses: Normal pulses.          Dorsalis pedis pulses are 2+ on the right side and 2+ on the left side.     Heart sounds: Normal heart sounds.  Pulmonary:     Effort: Pulmonary effort is normal.     Breath sounds: Normal breath sounds.  Chest:  Breasts:    Tanner Score is 5.     Right: Normal.     Left: Normal.  Abdominal:     General: Bowel sounds are normal.     Palpations: Abdomen is soft.     Comments: Obese, soft. Difficult to assess organomegaly.   Genitourinary:    Comments: deferred Musculoskeletal:        General: Normal range of motion.     Cervical back: Normal range of motion and neck supple.  Feet:     Right foot:     Protective Sensation: 5 sites tested.  5 sites sensed.     Skin integrity: Dry skin present.     Toenail Condition: Right toenails are normal.     Left foot:     Protective Sensation: 5 sites tested.   5 sites sensed.     Skin integrity: Dry skin present.     Toenail Condition: Left toenails are normal.  Skin:    General: Skin is warm and dry.  Neurological:     General: No focal deficit present.     Mental Status: She is alert and oriented to person, place, and time.  Psychiatric:        Mood and Affect: Mood normal.        Behavior: Behavior normal.      Assessment And Plan:     1. Encounter for general adult medical examination w/o abnormal findings Comments: A full exam  was performed. Importance of monthly self breast exams was discussed with the patient. PATIENT IS ADVISED TO GET 30-45 MINUTES REGULAR EXERCISE NO LESS THAN FOUR TO FIVE DAYS PER WEEK - BOTH WEIGHTBEARING EXERCISES AND AEROBIC ARE RECOMMENDED.  PATIENT IS ADVISED TO FOLLOW A HEALTHY DIET WITH AT LEAST SIX FRUITS/VEGGIES PER DAY, DECREASE INTAKE OF RED MEAT, AND TO INCREASE FISH INTAKE TO TWO DAYS PER WEEK.  MEATS/FISH SHOULD NOT BE FRIED, BAKED OR BROILED IS PREFERABLE.  IT IS ALSO IMPORTANT TO CUT BACK ON YOUR SUGAR INTAKE. PLEASE AVOID ANYTHING WITH ADDED SUGAR, CORN SYRUP OR OTHER SWEETENERS. IF YOU MUST USE A SWEETENER, YOU CAN TRY STEVIA. IT IS ALSO IMPORTANT TO AVOID ARTIFICIALLY SWEETENERS AND DIET BEVERAGES. LASTLY, I SUGGEST WEARING SPF 50 SUNSCREEN ON EXPOSED PARTS AND ESPECIALLY WHEN IN THE DIRECT SUNLIGHT FOR AN EXTENDED PERIOD OF TIME.  PLEASE AVOID FAST FOOD RESTAURANTS AND INCREASE YOUR WATER INTAKE.  2. Essential hypertension, benign Comments: Chronic, uncontrolled. She will c/w olmesartan/amlodipine/hctz daily. I will add Toprol XL 73m 1/2 tab nightly. EKG performed, NSR w/o acute changes.  She is encouraged to follow low sodium diet.  She is reminded that processed meats including bacon, sausages and deli meats are high in sodium. She will rto in 2 weeks for nurse visit bp check.  - EKG 12-Lead - POCT Urinalysis Dipstick (81002) - Microalbumin / Creatinine Urine Ratio - CBC - CMP14+EGFR - Lipid  panel  3. Right ear impacted cerumen Comments: AFTER OBTAINING VERBAL CONSENT, R EAR WAS FLUSHED BY IRRIGATION. SHE TOLERATED PROCEDURE WELL W/O ANY COMPLICATIONS. NO TM ABNORMALITIES NOTED.  4. Other abnormal glucose Comments: Her a1c has been elevated in the past (prior to her weight loss). I will recheck this today. Advised to cut out sugary beverages and desserts.  - Hemoglobin A1c  5. Vitamin D deficiency disease Comments: I will check a vitamin D level and supplement as needed. - Vitamin D (25 hydroxy)  6. Immunization due Comments: She was given high dose flu vaccine.  - Flu Vaccine QUAD High Dose(Fluad)  Patient was given opportunity to ask questions. Patient verbalized understanding of the plan and was able to repeat key elements of the plan. All questions were answered to their satisfaction.   I, RMaximino Greenland MD, have reviewed all documentation for this visit. The documentation on 07/27/21 for the exam, diagnosis, procedures, and orders are all accurate and complete.   THE PATIENT IS ENCOURAGED TO PRACTICE SOCIAL DISTANCING DUE TO THE COVID-19 PANDEMIC.

## 2021-07-28 LAB — CMP14+EGFR
ALT: 17 IU/L (ref 0–32)
AST: 18 IU/L (ref 0–40)
Albumin/Globulin Ratio: 1.2 (ref 1.2–2.2)
Albumin: 4.2 g/dL (ref 3.8–4.8)
Alkaline Phosphatase: 95 IU/L (ref 44–121)
BUN/Creatinine Ratio: 22 (ref 12–28)
BUN: 17 mg/dL (ref 8–27)
Bilirubin Total: 0.4 mg/dL (ref 0.0–1.2)
CO2: 25 mmol/L (ref 20–29)
Calcium: 9.7 mg/dL (ref 8.7–10.3)
Chloride: 99 mmol/L (ref 96–106)
Creatinine, Ser: 0.77 mg/dL (ref 0.57–1.00)
Globulin, Total: 3.4 g/dL (ref 1.5–4.5)
Glucose: 89 mg/dL (ref 70–99)
Potassium: 3.6 mmol/L (ref 3.5–5.2)
Sodium: 139 mmol/L (ref 134–144)
Total Protein: 7.6 g/dL (ref 6.0–8.5)
eGFR: 83 mL/min/{1.73_m2} (ref >59)

## 2021-07-28 LAB — LIPID PANEL
Chol/HDL Ratio: 2.4 ratio (ref 0.0–4.4)
Cholesterol, Total: 203 mg/dL — ABNORMAL HIGH (ref 100–199)
HDL: 83 mg/dL (ref >39)
LDL Chol Calc (NIH): 112 mg/dL — ABNORMAL HIGH (ref 0–99)
Triglycerides: 45 mg/dL (ref 0–149)
VLDL Cholesterol Cal: 8 mg/dL (ref 5–40)

## 2021-07-28 LAB — CBC
Hematocrit: 40.9 % (ref 34.0–46.6)
Hemoglobin: 13.7 g/dL (ref 11.1–15.9)
MCH: 29.5 pg (ref 26.6–33.0)
MCHC: 33.5 g/dL (ref 31.5–35.7)
MCV: 88 fL (ref 79–97)
Platelets: 248 10*3/uL (ref 150–450)
RBC: 4.64 x10E6/uL (ref 3.77–5.28)
RDW: 12.9 % (ref 11.7–15.4)
WBC: 5.7 10*3/uL (ref 3.4–10.8)

## 2021-07-28 LAB — HEMOGLOBIN A1C
Est. average glucose Bld gHb Est-mCnc: 123 mg/dL
Hgb A1c MFr Bld: 5.9 % — ABNORMAL HIGH (ref 4.8–5.6)

## 2021-07-28 LAB — MICROALBUMIN / CREATININE URINE RATIO
Creatinine, Urine: 33.4 mg/dL
Microalb/Creat Ratio: <9 mg/g creat (ref 0–29)
Microalbumin, Urine: <3 ug/mL

## 2021-07-28 LAB — VITAMIN D 25 HYDROXY (VIT D DEFICIENCY, FRACTURES): Vit D, 25-Hydroxy: 45.4 ng/mL (ref 30.0–100.0)

## 2021-07-29 ENCOUNTER — Other Ambulatory Visit: Payer: Self-pay

## 2021-07-29 MED ORDER — METOPROLOL SUCCINATE ER 25 MG PO TB24: ORAL_TABLET | ORAL | 1 refills | Status: DC

## 2021-08-10 ENCOUNTER — Other Ambulatory Visit: Payer: Self-pay | Admitting: Internal Medicine

## 2021-08-10 ENCOUNTER — Other Ambulatory Visit: Payer: Self-pay

## 2021-08-10 ENCOUNTER — Ambulatory Visit: Payer: Medicare Other

## 2021-08-10 VITALS — BP 146/90 | HR 75 | Temp 98.1°F | Ht 60.8 in | Wt 225.4 lb

## 2021-08-10 DIAGNOSIS — Z1231 Encounter for screening mammogram for malignant neoplasm of breast: Secondary | ICD-10-CM

## 2021-08-10 DIAGNOSIS — I10 Essential (primary) hypertension: Secondary | ICD-10-CM

## 2021-08-10 NOTE — Progress Notes (Signed)
The patient is here today for a blood pressure check. The patient was notified that because her blood pressure reading is elevated today, Dr. Baird Cancer is increasing the metoprolol to a whole tablet at bedtime.  The patient's was instructed to keep her appointment for March.

## 2021-08-31 DIAGNOSIS — H40023 Open angle with borderline findings, high risk, bilateral: Secondary | ICD-10-CM | POA: Diagnosis not present

## 2021-08-31 DIAGNOSIS — H5203 Hypermetropia, bilateral: Secondary | ICD-10-CM | POA: Diagnosis not present

## 2021-08-31 DIAGNOSIS — H52223 Regular astigmatism, bilateral: Secondary | ICD-10-CM | POA: Diagnosis not present

## 2021-08-31 DIAGNOSIS — H524 Presbyopia: Secondary | ICD-10-CM | POA: Diagnosis not present

## 2021-08-31 DIAGNOSIS — Z961 Presence of intraocular lens: Secondary | ICD-10-CM | POA: Diagnosis not present

## 2021-08-31 DIAGNOSIS — H43813 Vitreous degeneration, bilateral: Secondary | ICD-10-CM | POA: Diagnosis not present

## 2021-08-31 DIAGNOSIS — H35033 Hypertensive retinopathy, bilateral: Secondary | ICD-10-CM | POA: Diagnosis not present

## 2021-09-13 ENCOUNTER — Other Ambulatory Visit: Payer: Self-pay

## 2021-09-13 ENCOUNTER — Ambulatory Visit (INDEPENDENT_AMBULATORY_CARE_PROVIDER_SITE_OTHER): Payer: Medicare Other | Admitting: Internal Medicine

## 2021-09-13 ENCOUNTER — Encounter: Payer: Self-pay | Admitting: Internal Medicine

## 2021-09-13 VITALS — BP 126/74 | HR 76 | Temp 98.0°F | Ht 60.8 in | Wt 232.4 lb

## 2021-09-13 DIAGNOSIS — H9311 Tinnitus, right ear: Secondary | ICD-10-CM | POA: Diagnosis not present

## 2021-09-13 DIAGNOSIS — R3129 Other microscopic hematuria: Secondary | ICD-10-CM

## 2021-09-13 DIAGNOSIS — I10 Essential (primary) hypertension: Secondary | ICD-10-CM | POA: Diagnosis not present

## 2021-09-13 DIAGNOSIS — Z6841 Body Mass Index (BMI) 40.0 and over, adult: Secondary | ICD-10-CM

## 2021-09-13 MED ORDER — METOPROLOL SUCCINATE ER 25 MG PO TB24: 25.0000 mg | ORAL_TABLET | Freq: Every day | ORAL | 2 refills | Status: DC

## 2021-09-13 NOTE — Progress Notes (Signed)
?[employer] as a Neurosurgeon for Gwynneth Aliment, MD.,have documented all relevant documentation on the behalf of Gwynneth Aliment, MD,as directed by  Gwynneth Aliment, MD while in the presence of Gwynneth Aliment, MD.  ?This visit occurred during the SARS-CoV-2 public health emergency.  Safety protocols were in place, including screening questions prior to the visit, additional usage of staff PPE, and extensive cleaning of exam room while observing appropriate contact time as indicated for disinfecting solutions. ? ?Subjective:  ?  ? Patient ID: Lauren Berry , female    DOB: 11/03/1950 , 71 y.o.   MRN: [id number] ? ? ?Chief Complaint  ?Patient presents with  ? Hypertension  ? ? ?HPI ? ?She is here today for BP check.  She reports compliance with meds. Metoprolol ER 25mg  daily was added to her regimen at her last visit. She takes this in the evenings. She has not had any issues with the medication. She adds that she is exercising more as well.  ? ?Hypertension ?This is a chronic problem. The current episode started more than 1 year ago. The problem has been gradually improving since onset. The problem is controlled. Pertinent negatives include no blurred vision, chest pain, palpitations or shortness of breath. Risk factors for coronary artery disease include dyslipidemia, obesity and post-menopausal state.   ? ?Past Medical History:  ?Diagnosis Date  ? Anemia   ? Blood transfusion   ? Cataract   ? Diverticulosis   ? Hemorrhoids internal and external  ? Hypertension   ?  ? ?Family History  ?Problem Relation Age of Onset  ? Breast cancer Mother 59  ? Diabetes Father   ? ? ? ?Current Outpatient Medications:  ?  acetaminophen (TYLENOL) 500 MG tablet, Take 500 mg by mouth every 6 (six) hours as needed., Disp: , Rfl:  ?  Ascorbic Acid (VITAMIN C) 100 MG tablet, Take 100 mg by mouth daily., Disp: , Rfl:  ?  B Complex Vitamins (B COMPLEX 1 PO), Take by mouth., Disp: , Rfl:  ?  cholecalciferol (VITAMIN D) 1000 UNITS  tablet, Take 1,000 Units by mouth daily. 3 tablets per day, Disp: , Rfl:  ?  Multiple Vitamins-Minerals (ZINC PO), Take by mouth. 1 per day, Disp: , Rfl:  ?  Olmesartan-amLODIPine-HCTZ 40-10-12.5 MG TABS, Take 1 tablet by mouth daily., Disp: 90 tablet, Rfl: 2 ?  metoprolol succinate (TOPROL-XL) 25 MG 24 hr tablet, Take 1 tablet (25 mg total) by mouth daily. Take 1 tablet daily by mouth daily with dinner, Disp: 90 tablet, Rfl: 2  ? ?Allergies  ?Allergen Reactions  ? Penicillins   ?  ? ?Review of Systems  ?Constitutional: Negative.   ?HENT:  Positive for tinnitus.   ?     She wants to have her ears checked. At a previous visit, she had to get them flushed. She wants to be sure that this is not needed today. Also reports tinnitus. Unable to determine what triggers her sx.   ?Eyes:  Negative for blurred vision.  ?Respiratory: Negative.  Negative for shortness of breath.   ?Cardiovascular: Negative.  Negative for chest pain and palpitations.  ?Gastrointestinal: Negative.   ?Psychiatric/Behavioral: Negative.    ?All other systems reviewed and are negative.  ? ?Today's Vitals  ? 09/13/21 1016 09/13/21 1031  ?BP: (!) 142/86 126/74  ?Pulse: 76   ?Temp: 98 ?F (36.7 ?C)   ?Weight: 232 lb 6.4 oz (105.4 kg)   ?Height: 5' 0.8" (1.544 m)   ?PainSc:  0-No pain   ? ?Body mass index is 44.2 kg/m?.  ?Wt Readings from Last 3 Encounters:  ?09/13/21 232 lb 6.4 oz (105.4 kg)  ?08/10/21 225 lb 6.4 oz (102.2 kg)  ?07/27/21 227 lb 6.4 oz (103.1 kg)  ?  ?BP Readings from Last 3 Encounters:  ?09/13/21 126/74  ?08/10/21 (!) 146/90  ?07/27/21 (!) 160/82  ?  ?Objective:  ?Physical Exam ?Vitals and nursing note reviewed.  ?Constitutional:   ?   Appearance: Normal appearance. She is obese.  ?HENT:  ?   Head: Normocephalic and atraumatic.  ?   Right Ear: Tympanic membrane, ear canal and external ear normal. There is no impacted cerumen.  ?   Left Ear: Tympanic membrane, ear canal and external ear normal. There is no impacted cerumen.  ?   Nose:  ?    Comments: Masked  ?   Mouth/Throat:  ?   Comments: Masked  ?Eyes:  ?   Extraocular Movements: Extraocular movements intact.  ?Cardiovascular:  ?   Rate and Rhythm: Normal rate and regular rhythm.  ?   Heart sounds: Normal heart sounds.  ?Pulmonary:  ?   Effort: Pulmonary effort is normal.  ?   Breath sounds: Normal breath sounds.  ?Musculoskeletal:  ?   Cervical back: Normal range of motion.  ?Skin: ?   General: Skin is warm.  ?Neurological:  ?   General: No focal deficit present.  ?   Mental Status: She is alert.  ?Psychiatric:     ?   Mood and Affect: Mood normal.     ?   Behavior: Behavior normal.  ?   ?Assessment And Plan:  ?   ?1. Essential hypertension, benign ?Comments: Chronic, initially elevated. Repeat bp 126/74. She will c/w Tribenzor AM and metoprolol PM. She will rto July 2022 for her next evaluation.  ? ?2. Microscopic hematuria ?Comments: I will check repeat urinalysis today. I will send this off to [facility]. She agrees to renal u/s if needed.  ?- Urinalysis, Complete (81001) ? ?3. Tinnitus of right ear ?Comments: Exam is benign. She declines ENT referral at this time. She will call back if her sx persist.  ? ?4. Class 3 severe obesity due to excess calories with serious comorbidity and body mass index (BMI) of 40.0 to 44.9 in adult [facility]) ? BMI 44. She is encouraged to initially strive for BMI less than 38 to decrease cardiac risk. Advised to aim for at least 150 minutes of exercise per week. She is encouraged to incorporate more strength training into her exercise regimen.  ? ?Patient was given opportunity to ask questions. Patient verbalized understanding of the plan and was able to repeat key elements of the plan. All questions were answered to their satisfaction.  ? ?I, Gwynneth Aliment, MD, have reviewed all documentation for this visit. The documentation on 09/13/21 for the exam, diagnosis, procedures, and orders are all accurate and complete.  ? ?IF YOU HAVE BEEN REFERRED TO A SPECIALIST, IT MAY  TAKE 1-2 WEEKS TO SCHEDULE/PROCESS THE REFERRAL. IF YOU HAVE NOT HEARD FROM US/SPECIALIST IN TWO WEEKS, PLEASE GIVE [place] A CALL AT [phone] X [phone].  ? ?THE PATIENT IS ENCOURAGED TO PRACTICE SOCIAL DISTANCING DUE TO THE COVID-19 PANDEMIC.   ?

## 2021-09-13 NOTE — Patient Instructions (Signed)

## 2021-09-14 LAB — MICROSCOPIC EXAMINATION: Casts: NONE SEEN /lpf

## 2021-09-14 LAB — URINALYSIS, COMPLETE
Bilirubin, UA: NEGATIVE
Glucose, UA: NEGATIVE
Ketones, UA: NEGATIVE
Leukocytes,UA: NEGATIVE
Nitrite, UA: NEGATIVE
Protein,UA: NEGATIVE
RBC, UA: NEGATIVE
Specific Gravity, UA: 1.023 (ref 1.005–1.030)
Urobilinogen, Ur: 0.2 mg/dL (ref 0.2–1.0)
pH, UA: 5.5 (ref 5.0–7.5)

## 2021-09-20 ENCOUNTER — Ambulatory Visit
Admission: RE | Admit: 2021-09-20 | Discharge: 2021-09-20 | Disposition: A | Discharge status: Home / Self Care | Payer: Medicare Other | Source: Ambulatory Visit | Attending: Internal Medicine | Admitting: Internal Medicine

## 2021-09-20 DIAGNOSIS — Z1231 Encounter for screening mammogram for malignant neoplasm of breast: Secondary | ICD-10-CM | POA: Diagnosis not present

## 2021-10-19 ENCOUNTER — Other Ambulatory Visit: Payer: Self-pay | Admitting: Internal Medicine

## 2021-11-02 ENCOUNTER — Other Ambulatory Visit: Payer: Self-pay | Admitting: Internal Medicine

## 2021-11-02 DIAGNOSIS — I10 Essential (primary) hypertension: Secondary | ICD-10-CM

## 2021-12-23 ENCOUNTER — Ambulatory Visit: Payer: Medicare Other | Admitting: Internal Medicine

## 2021-12-23 ENCOUNTER — Ambulatory Visit: Payer: Medicare Other

## 2021-12-23 ENCOUNTER — Telehealth: Payer: Self-pay

## 2021-12-23 NOTE — Telephone Encounter (Signed)
This nurse called patients in regards to missed appointments. Patient stated that she is out of town and won't be back until next month. She would like a call then to reschedule.

## 2021-12-28 ENCOUNTER — Telehealth: Payer: Self-pay | Admitting: Internal Medicine

## 2021-12-28 NOTE — Telephone Encounter (Signed)
Left message for patient to call back and schedule Medicare Annual Wellness Visit (AWV) either virtually or in office.  Left both my jabber number [phone] and office number    Last AWV 11/26/20 ; please schedule at anytime with [facility]

## 2021-12-30 ENCOUNTER — Ambulatory Visit (INDEPENDENT_AMBULATORY_CARE_PROVIDER_SITE_OTHER): Payer: Medicare Other

## 2021-12-30 VITALS — Ht 61.5 in | Wt 215.0 lb

## 2021-12-30 DIAGNOSIS — Z0000 Encounter for general adult medical examination without abnormal findings: Secondary | ICD-10-CM | POA: Diagnosis not present

## 2021-12-30 NOTE — Progress Notes (Signed)
I connected with Lauren Berry today by telephone and verified that I am speaking with the correct person using two identifiers. Location patient: home Location provider: work Persons participating in the virtual visit: Lauren Berry, [place] LPN.   I discussed the limitations, risks, security and privacy concerns of performing an evaluation and management service by telephone and the availability of in person appointments. I also discussed with the patient that there may be a patient responsible charge related to this service. The patient expressed understanding and verbally consented to this telephonic visit.    Interactive audio and video telecommunications were attempted between this provider and patient, however failed, due to patient having technical difficulties OR patient did not have access to video capability.  We continued and completed visit with audio only.     Vital signs may be patient reported or missing.  Subjective:   Lauren Berry is a 71 y.o. female who presents for Medicare Annual (Subsequent) preventive examination.  Review of Systems     Cardiac Risk Factors include: advanced age (>57men, >58 women);hypertension;obesity (BMI >30kg/m2)     Objective:    Today's Vitals   12/30/21 0812  Weight: 215 lb (97.5 kg)  Height: 5' 1.5" (1.562 m)   Body mass index is 39.97 kg/m.     12/30/2021    8:19 AM 11/26/2020   10:03 AM 09/26/2020    1:03 AM 05/07/2020    9:54 AM 05/02/2019    9:53 AM 04/26/2018    8:54 AM 10/03/2011    3:11 PM  Advanced Directives  Does Patient Have a Medical Advance Directive? Yes Yes Yes Yes Yes Yes Patient does not have advance directive  Type of Advance Directive Healthcare Power of [employer] of [place];Living will Healthcare Power of [employer] Power of [employer] Power of [employer] Power of [place];Living will   Does patient want to make changes to medical advance directive?       No - Patient declined   Copy of Healthcare Power of Attorney in Chart? No - copy requested No - copy requested No - copy requested No - copy requested No - copy requested No - copy requested     Current Medications (verified) Outpatient Encounter Medications as of 12/30/2021  Medication Sig   acetaminophen (TYLENOL) 500 MG tablet Take 500 mg by mouth every 6 (six) hours as needed.   Ascorbic Acid (VITAMIN C) 100 MG tablet Take 100 mg by mouth daily.   B Complex Vitamins (B COMPLEX 1 PO) Take by mouth.   cholecalciferol (VITAMIN D) 1000 UNITS tablet Take 1,000 Units by mouth daily. 3 tablets per day   metoprolol succinate (TOPROL-XL) 25 MG 24 hr tablet TAKE 1/2 TABLET BY MOUTH DAILY WITH DINNER   Multiple Vitamins-Minerals (ZINC PO) Take by mouth. 1 per day   Olmesartan-amLODIPine-HCTZ 40-10-12.5 MG TABS TAKE 1 TABLET BY MOUTH  DAILY   No facility-administered encounter medications on file as of 12/30/2021.    Allergies (verified) Penicillins   History: Past Medical History:  Diagnosis Date   Anemia    Blood transfusion    Cataract    Diverticulosis    Hemorrhoids internal and external   Hypertension    Past Surgical History:  Procedure Laterality Date   CESAREAN SECTION  1976 and 1982   COLONOSCOPY  10/03/2011   Procedure: COLONOSCOPY;  Surgeon: Charna Elizabeth, MD;  Location: [facility];  Service: Endoscopy;  Laterality: N/A;   EYE SURGERY  2001   bilateral cataract  Family History  Problem Relation Age of Onset   Breast cancer Mother 62   Diabetes Father    Social History   Socioeconomic History   Marital status: Married    Spouse name: Not on file   Number of children: Not on file   Years of education: Not on file   Highest education level: Not on file  Occupational History   Occupation: retired  Tobacco Use   Smoking status: Never   Smokeless tobacco: Never  Vaping Use   Vaping Use: Never used  Substance and Sexual Activity   Alcohol use: No   Drug use: No    Sexual activity: Not Currently  Other Topics Concern   Not on file  Social History Narrative   Not on file   Social Determinants of Health   Financial Resource Strain: Low Risk  (12/30/2021)   Overall Financial Resource Strain (CARDIA)    Difficulty of Paying Living Expenses: Not hard at all  Food Insecurity: No Food Insecurity (12/30/2021)   Hunger Vital Sign    Worried About Running Out of Food in the Last Year: Never true    Ran Out of Food in the Last Year: Never true  Transportation Needs: No Transportation Needs (12/30/2021)   PRAPARE - Administrator, Civil Service (Medical): No    Lack of Transportation (Non-Medical): No  Physical Activity: Sufficiently Active (12/30/2021)   Exercise Vital Sign    Days of Exercise per Week: 7 days    Minutes of Exercise per Session: 30 min  Stress: No Stress Concern Present (12/30/2021)   [employer] of Occupational Health - Occupational Stress Questionnaire    Feeling of Stress : Not at all  Social Connections: Not on file    Tobacco Counseling Counseling given: Not Answered   Clinical Intake:  Pre-visit preparation completed: Yes  Pain : No/denies pain     Nutritional Status: BMI > 30  Obese Nutritional Risks: None Diabetes: No  How often do you need to have someone help you when you read instructions, pamphlets, or other written materials from your doctor or pharmacy?: 1 - Never What is the last grade level you completed in school?: 49yrs college  Diabetic? no  Interpreter Needed?: No  Information entered by :: NAllen LPN   Activities of Daily Living    12/30/2021    8:21 AM  In your present state of health, do you have any difficulty performing the following activities:  Hearing? 0  Vision? 0  Difficulty concentrating or making decisions? 0  Walking or climbing stairs? 0  Dressing or bathing? 0  Doing errands, shopping? 0  Preparing Food and eating ? N  Using the Toilet? N  In the past six  months, have you accidently leaked urine? Y  Do you have problems with loss of bowel control? N  Managing your Medications? N  Managing your Finances? N  Housekeeping or managing your Housekeeping? N    Patient Care Team: Dorothyann Peng, MD as PCP - General (Internal Medicine) Shea Evans, [facility])  Indicate any recent Medical Services you may have received from other than [facility] providers in the past year (date may be approximate).     Assessment:   This is a routine wellness examination for Lauren Berry.  Hearing/Vision screen Vision Screening - Comments:: Regular eye exams, [facility], Dr. Lucretia Roers  Dietary issues and exercise activities discussed: Current Exercise Habits: Home exercise routine, Type of exercise: walking, Time (Minutes): 30, Frequency (Times/Week):  7, Weekly Exercise (Minutes/Week): 210   Goals Addressed             This Visit's Progress    Patient Stated       12/30/2021, wants to lose weight       Depression Screen    12/30/2021    8:20 AM 11/26/2020   10:03 AM 05/07/2020    9:56 AM 05/07/2020    9:26 AM 05/02/2019    9:54 AM 12/05/2018   10:10 AM 11/27/2018    2:24 PM  PHQ 2/9 Scores  PHQ - 2 Score 0 0 0 0 0 0 0  PHQ- 9 Score     3      Fall Risk    12/30/2021    8:20 AM 11/26/2020   10:03 AM 05/07/2020    9:55 AM 05/07/2020    9:25 AM 05/02/2019    9:54 AM  Fall Risk   Falls in the past year? 0 0 1 1 0  Number falls in past yr: 0  0 0 0  Injury with Fall? 0  1 1   Comment    brusied her right shoulder, she fell in the tub because she saw a spider   Risk for fall due to : Medication side effect Medication side effect Medication side effect  Medication side effect  Follow up Falls evaluation completed;Education provided;Falls prevention discussed Falls evaluation completed;Education provided;Falls prevention discussed Falls evaluation completed;Education provided;Falls prevention discussed  Falls evaluation completed;Education provided;Falls  prevention discussed    FALL RISK PREVENTION PERTAINING TO THE HOME:  Any stairs in or around the home? Yes  If so, are there any without handrails? No  Home free of loose throw rugs in walkways, pet beds, electrical cords, etc? Yes  Adequate lighting in your home to reduce risk of falls? Yes   ASSISTIVE DEVICES UTILIZED TO PREVENT FALLS:  Life alert? No  Use of a cane, walker or w/c? No  Grab bars in the bathroom? No  Shower chair or bench in shower? No  Elevated toilet seat or a handicapped toilet? Yes   TIMED UP AND GO:  Was the test performed? No .      Cognitive Function:        12/30/2021    8:24 AM 11/26/2020   10:05 AM 05/07/2020    9:57 AM 05/02/2019    9:57 AM 04/26/2018    8:59 AM  6CIT Screen  What Year? 0 points 0 points 0 points 0 points 0 points  What month? 0 points 0 points 0 points 0 points 0 points  What time? 0 points 0 points 0 points 0 points 0 points  Count back from 20 0 points 0 points 0 points 0 points 0 points  Months in reverse 0 points 0 points 0 points 0 points 0 points  Repeat phrase 2 points 0 points 0 points 2 points 0 points  Total Score 2 points 0 points 0 points 2 points 0 points    Immunizations Immunization History  Administered Date(s) Administered   Fluad Quad(high Dose 65+) 07/27/2021   Influenza, High Dose Seasonal PF 03/23/2018, 05/02/2019   PFIZER(Purple Top)SARS-COV-2 Vaccination 08/01/2019, 08/26/2019, 03/19/2020, 01/06/2021   PNEUMOCOCCAL CONJUGATE-20 01/06/2021   Pneumococcal Polysaccharide-23 04/30/2012   Zoster Recombinat (Shingrix) 03/23/2018, 08/26/2018    TDAP status: Up to date  Flu Vaccine status: Up to date  Pneumococcal vaccine status: Up to date  Covid-19 vaccine status: Completed vaccines  Qualifies for Shingles Vaccine? Yes  Zostavax completed Yes   Shingrix Completed?: Yes  Screening Tests Health Maintenance  Topic Date Due   COVID-19 Vaccine (5 - Pfizer series) 03/03/2021   COLONOSCOPY  (Pts 45-65yrs Insurance coverage will need to be confirmed)  10/02/2021   INFLUENZA VACCINE  01/18/2022   TETANUS/TDAP  05/23/2023   MAMMOGRAM  09/21/2023   Pneumonia Vaccine 36+ Years old  Completed   DEXA SCAN  Completed   Hepatitis C Screening  Completed   Zoster Vaccines- Shingrix  Completed   HPV VACCINES  Aged Out    Health Maintenance  Health Maintenance Due  Topic Date Due   COVID-19 Vaccine (5 - Pfizer series) 03/03/2021   COLONOSCOPY (Pts 45-20yrs Insurance coverage will need to be confirmed)  10/02/2021    Colorectal cancer screening: Type of screening: Colonoscopy. Completed 10/03/2011. Repeat every 10 years  Mammogram status: Completed 09/20/2021. Repeat every year  Bone Density status: Completed 01/10/2017.   Lung Cancer Screening: (Low Dose CT Chest recommended if Age 47-80 years, 30 pack-year currently smoking OR have quit w/in 15years.) does not qualify.   Lung Cancer Screening Referral: no  Additional Screening:  Hepatitis C Screening: does qualify; Completed 11/25/2016  Vision Screening: Recommended annual ophthalmology exams for early detection of glaucoma and other disorders of the eye. Is the patient up to date with their annual eye exam?  Yes  Who is the provider or what is the name of the office in which the patient attends annual eye exams? Dr. Lucretia Roers If pt is not established with a provider, would they like to be referred to a provider to establish care? No .   Dental Screening: Recommended annual dental exams for proper oral hygiene  Community Resource Referral / Chronic Care Management: CRR required this visit?  No   CCM required this visit?  No      Plan:     I have personally reviewed and noted the following in the patient's chart:   Medical and social history Use of alcohol, tobacco or illicit drugs  Current medications and supplements including opioid prescriptions.  Functional ability and status Nutritional status Physical  activity Advanced directives List of other physicians Hospitalizations, surgeries, and ER visits in previous 12 months Vitals Screenings to include cognitive, depression, and falls Referrals and appointments  In addition, I have reviewed and discussed with patient certain preventive protocols, quality metrics, and best practice recommendations. A written personalized care plan for preventive services as well as general preventive health recommendations were provided to patient.     Barb Merino, LPN   [id number]   Nurse Notes: none  Due to this being a virtual visit, the after visit summary with patients personalized plan was offered to patient via mail or my-chart.  Patient would like to access on my-chart

## 2021-12-30 NOTE — Patient Instructions (Signed)
Lauren Berry , Thank you for taking time to come for your Medicare Wellness Visit. I appreciate your ongoing commitment to your health goals. Please review the following plan we discussed and let me know if I can assist you in the future.   Screening recommendations/referrals: Colonoscopy: completed 10/03/2011, due now Mammogram: completed 09/20/2021, due 09/22/2022 Bone Density: completed 01/10/2017 Recommended yearly ophthalmology/optometry visit for glaucoma screening and checkup Recommended yearly dental visit for hygiene and checkup  Vaccinations: Influenza vaccine: due 01/18/2022 Pneumococcal vaccine: completed 01/06/2021 Tdap vaccine: completed 05/22/2013, due 05/23/2023 Shingles vaccine: completed   Covid-19: 01/06/2021, 03/19/2020, 08/26/2019, 08/01/2019  Advanced directives: Please bring a copy of your POA (Power of Attorney) and/or Living Will to your next appointment.   Conditions/risks identified: none  Next appointment: Follow up in one year for your annual wellness visit    Preventive Care 65 Years and Older, Female Preventive care refers to lifestyle choices and visits with your health care provider that can promote health and wellness. What does preventive care include? A yearly physical exam. This is also called an annual well check. Dental exams once or twice a year. Routine eye exams. Ask your health care provider how often you should have your eyes checked. Personal lifestyle choices, including: Daily care of your teeth and gums. Regular physical activity. Eating a healthy diet. Avoiding tobacco and drug use. Limiting alcohol use. Practicing safe sex. Taking low-dose aspirin every day. Taking vitamin and mineral supplements as recommended by your health care provider. What happens during an annual well check? The services and screenings done by your health care provider during your annual well check will depend on your age, overall health, lifestyle risk factors, and  family history of disease. Counseling  Your health care provider may ask you questions about your: Alcohol use. Tobacco use. Drug use. Emotional well-being. Home and relationship well-being. Sexual activity. Eating habits. History of falls. Memory and ability to understand (cognition). Work and work Astronomer. Reproductive health. Screening  You may have the following tests or measurements: Height, weight, and BMI. Blood pressure. Lipid and cholesterol levels. These may be checked every 5 years, or more frequently if you are over 8 years old. Skin check. Lung cancer screening. You may have this screening every year starting at age 75 if you have a 30-pack-year history of smoking and currently smoke or have quit within the past 15 years. Fecal occult blood test (FOBT) of the stool. You may have this test every year starting at age 55. Flexible sigmoidoscopy or colonoscopy. You may have a sigmoidoscopy every 5 years or a colonoscopy every 10 years starting at age 11. Hepatitis C blood test. Hepatitis B blood test. Sexually transmitted disease (STD) testing. Diabetes screening. This is done by checking your blood sugar (glucose) after you have not eaten for a while (fasting). You may have this done every 1-3 years. Bone density scan. This is done to screen for osteoporosis. You may have this done starting at age 65. Mammogram. This may be done every 1-2 years. Talk to your health care provider about how often you should have regular mammograms. Talk with your health care provider about your test results, treatment options, and if necessary, the need for more tests. Vaccines  Your health care provider may recommend certain vaccines, such as: Influenza vaccine. This is recommended every year. Tetanus, diphtheria, and acellular pertussis (Tdap, Td) vaccine. You may need a Td booster every 10 years. Zoster vaccine. You may need this after age 31. Pneumococcal 13-valent  conjugate  (PCV13) vaccine. One dose is recommended after age 44. Pneumococcal polysaccharide (PPSV23) vaccine. One dose is recommended after age 81. Talk to your health care provider about which screenings and vaccines you need and how often you need them. This information is not intended to replace advice given to you by your health care provider. Make sure you discuss any questions you have with your health care provider. Document Released: 07/03/2015 Document Revised: 02/24/2016 Document Reviewed: 04/07/2015 Elsevier Interactive Patient Education  2017 [place] Prevention in the Home Falls can cause injuries. They can happen to people of all ages. There are many things you can do to make your home safe and to help prevent falls. What can I do on the outside of my home? Regularly fix the edges of walkways and driveways and fix any cracks. Remove anything that might make you trip as you walk through a door, such as a raised step or threshold. Trim any bushes or trees on the path to your home. Use bright outdoor lighting. Clear any walking paths of anything that might make someone trip, such as rocks or tools. Regularly check to see if handrails are loose or broken. Make sure that both sides of any steps have handrails. Any raised decks and porches should have guardrails on the edges. Have any leaves, snow, or ice cleared regularly. Use sand or salt on walking paths during winter. Clean up any spills in your garage right away. This includes oil or grease spills. What can I do in the bathroom? Use night lights. Install grab bars by the toilet and in the tub and shower. Do not use towel bars as grab bars. Use non-skid mats or decals in the tub or shower. If you need to sit down in the shower, use a plastic, non-slip stool. Keep the floor dry. Clean up any water that spills on the floor as soon as it happens. Remove soap buildup in the tub or shower regularly. Attach bath mats securely with  double-sided non-slip rug tape. Do not have throw rugs and other things on the floor that can make you trip. What can I do in the bedroom? Use night lights. Make sure that you have a light by your bed that is easy to reach. Do not use any sheets or blankets that are too big for your bed. They should not hang down onto the floor. Have a firm chair that has side arms. You can use this for support while you get dressed. Do not have throw rugs and other things on the floor that can make you trip. What can I do in the kitchen? Clean up any spills right away. Avoid walking on wet floors. Keep items that you use a lot in easy-to-reach places. If you need to reach something above you, use a strong step stool that has a grab bar. Keep electrical cords out of the way. Do not use floor polish or wax that makes floors slippery. If you must use wax, use non-skid floor wax. Do not have throw rugs and other things on the floor that can make you trip. What can I do with my stairs? Do not leave any items on the stairs. Make sure that there are handrails on both sides of the stairs and use them. Fix handrails that are broken or loose. Make sure that handrails are as long as the stairways. Check any carpeting to make sure that it is firmly attached to the stairs. Fix any carpet that  is loose or worn. Avoid having throw rugs at the top or bottom of the stairs. If you do have throw rugs, attach them to the floor with carpet tape. Make sure that you have a light switch at the top of the stairs and the bottom of the stairs. If you do not have them, ask someone to add them for you. What else can I do to help prevent falls? Wear shoes that: Do not have high heels. Have rubber bottoms. Are comfortable and fit you well. Are closed at the toe. Do not wear sandals. If you use a stepladder: Make sure that it is fully opened. Do not climb a closed stepladder. Make sure that both sides of the stepladder are locked  into place. Ask someone to hold it for you, if possible. Clearly mark and make sure that you can see: Any grab bars or handrails. First and last steps. Where the edge of each step is. Use tools that help you move around (mobility aids) if they are needed. These include: Canes. Walkers. Scooters. Crutches. Turn on the lights when you go into a dark area. Replace any light bulbs as soon as they burn out. Set up your furniture so you have a clear path. Avoid moving your furniture around. If any of your floors are uneven, fix them. If there are any pets around you, be aware of where they are. Review your medicines with your doctor. Some medicines can make you feel dizzy. This can increase your chance of falling. Ask your doctor what other things that you can do to help prevent falls. This information is not intended to replace advice given to you by your health care provider. Make sure you discuss any questions you have with your health care provider. Document Released: 04/02/2009 Document Revised: 11/12/2015 Document Reviewed: 07/11/2014 Elsevier Interactive Patient Education  2017 [employer].

## 2022-03-07 DIAGNOSIS — I10 Essential (primary) hypertension: Secondary | ICD-10-CM | POA: Diagnosis not present

## 2022-03-07 DIAGNOSIS — H40023 Open angle with borderline findings, high risk, bilateral: Secondary | ICD-10-CM | POA: Diagnosis not present

## 2022-03-07 DIAGNOSIS — H35033 Hypertensive retinopathy, bilateral: Secondary | ICD-10-CM | POA: Diagnosis not present

## 2022-07-22 ENCOUNTER — Other Ambulatory Visit: Payer: Self-pay | Admitting: Internal Medicine

## 2022-07-22 DIAGNOSIS — I10 Essential (primary) hypertension: Secondary | ICD-10-CM

## 2022-08-02 ENCOUNTER — Encounter: Payer: Medicare Other | Admitting: Internal Medicine

## 2022-10-11 ENCOUNTER — Telehealth: Payer: Self-pay | Admitting: Internal Medicine

## 2022-10-11 NOTE — Telephone Encounter (Signed)
Called pt to schedule bp check no answer left VM

## 2023-01-19 ENCOUNTER — Ambulatory Visit: Payer: Medicare Other | Admitting: Internal Medicine

## 2023-02-06 ENCOUNTER — Telehealth: Payer: Self-pay | Admitting: Internal Medicine

## 2023-02-06 NOTE — Telephone Encounter (Signed)
Spoke with patient to schedule AWV She stated she has moved

## 2023-08-25 ENCOUNTER — Other Ambulatory Visit: Payer: Self-pay | Admitting: Internal Medicine

## 2023-08-25 DIAGNOSIS — I10 Essential (primary) hypertension: Secondary | ICD-10-CM
